# Patient Record
Sex: Female | Born: 1970 | Race: White | Hispanic: No | Marital: Married
Health system: Southern US, Community
[De-identification: ages and names within clinical notes are randomized; demographics above are authoritative.]

---

## 2006-01-02 ENCOUNTER — Encounter: Admission: RE | Admit: 2006-01-02 | Discharge: 2006-01-02 | Payer: Self-pay | Admitting: Family Medicine

## 2007-02-08 ENCOUNTER — Encounter: Admission: RE | Admit: 2007-02-08 | Discharge: 2007-02-08 | Payer: Self-pay | Admitting: Family Medicine

## 2008-11-16 ENCOUNTER — Ambulatory Visit: Payer: Self-pay | Admitting: Interventional Radiology

## 2008-11-16 ENCOUNTER — Emergency Department (HOSPITAL_BASED_OUTPATIENT_CLINIC_OR_DEPARTMENT_OTHER): Admission: EM | Admit: 2008-11-16 | Discharge: 2008-11-16 | Payer: Self-pay | Admitting: Emergency Medicine

## 2010-02-24 ENCOUNTER — Encounter: Payer: Self-pay | Admitting: Family Medicine

## 2010-04-08 ENCOUNTER — Other Ambulatory Visit: Payer: Self-pay | Admitting: *Deleted

## 2010-04-08 DIAGNOSIS — Z1239 Encounter for other screening for malignant neoplasm of breast: Secondary | ICD-10-CM

## 2010-04-15 ENCOUNTER — Ambulatory Visit
Admission: RE | Admit: 2010-04-15 | Discharge: 2010-04-15 | Disposition: A | Discharge status: Home / Self Care | Payer: BC Managed Care – PPO | Source: Ambulatory Visit | Attending: Physician Assistant | Admitting: Physician Assistant

## 2010-04-15 DIAGNOSIS — Z1239 Encounter for other screening for malignant neoplasm of breast: Secondary | ICD-10-CM

## 2013-11-16 ENCOUNTER — Other Ambulatory Visit: Payer: Self-pay

## 2013-11-16 DIAGNOSIS — Z1231 Encounter for screening mammogram for malignant neoplasm of breast: Secondary | ICD-10-CM

## 2013-11-25 ENCOUNTER — Ambulatory Visit
Admission: RE | Admit: 2013-11-25 | Discharge: 2013-11-25 | Disposition: A | Discharge status: Home / Self Care | Payer: BC Managed Care – PPO | Source: Ambulatory Visit

## 2013-11-25 DIAGNOSIS — Z1231 Encounter for screening mammogram for malignant neoplasm of breast: Secondary | ICD-10-CM

## 2014-11-02 ENCOUNTER — Other Ambulatory Visit: Payer: Self-pay

## 2014-11-02 DIAGNOSIS — Z1231 Encounter for screening mammogram for malignant neoplasm of breast: Secondary | ICD-10-CM

## 2014-11-28 ENCOUNTER — Ambulatory Visit: Payer: Self-pay

## 2014-12-12 ENCOUNTER — Ambulatory Visit
Admission: RE | Admit: 2014-12-12 | Discharge: 2014-12-12 | Disposition: A | Discharge status: Home / Self Care | Payer: BLUE CROSS/BLUE SHIELD | Source: Ambulatory Visit

## 2014-12-12 DIAGNOSIS — Z1231 Encounter for screening mammogram for malignant neoplasm of breast: Secondary | ICD-10-CM

## 2017-11-03 ENCOUNTER — Other Ambulatory Visit: Payer: Self-pay | Admitting: Obstetrics & Gynecology

## 2017-11-03 DIAGNOSIS — Z1231 Encounter for screening mammogram for malignant neoplasm of breast: Secondary | ICD-10-CM

## 2017-12-03 ENCOUNTER — Ambulatory Visit
Admission: RE | Admit: 2017-12-03 | Discharge: 2017-12-03 | Disposition: A | Discharge status: Home / Self Care | Payer: BLUE CROSS/BLUE SHIELD | Source: Ambulatory Visit | Attending: Obstetrics & Gynecology | Admitting: Obstetrics & Gynecology

## 2017-12-03 DIAGNOSIS — Z1231 Encounter for screening mammogram for malignant neoplasm of breast: Secondary | ICD-10-CM

## 2019-11-08 ENCOUNTER — Other Ambulatory Visit: Payer: Self-pay | Admitting: Family Medicine

## 2019-11-08 DIAGNOSIS — Z1231 Encounter for screening mammogram for malignant neoplasm of breast: Secondary | ICD-10-CM

## 2019-11-25 ENCOUNTER — Other Ambulatory Visit: Payer: Self-pay

## 2019-11-25 ENCOUNTER — Ambulatory Visit
Admission: RE | Admit: 2019-11-25 | Discharge: 2019-11-25 | Disposition: A | Discharge status: Home / Self Care | Payer: BC Managed Care – PPO | Source: Ambulatory Visit | Attending: Family Medicine | Admitting: Family Medicine

## 2019-11-25 DIAGNOSIS — Z1231 Encounter for screening mammogram for malignant neoplasm of breast: Secondary | ICD-10-CM

## 2019-11-30 ENCOUNTER — Other Ambulatory Visit: Payer: Self-pay | Admitting: Family Medicine

## 2019-11-30 DIAGNOSIS — R928 Other abnormal and inconclusive findings on diagnostic imaging of breast: Secondary | ICD-10-CM

## 2019-12-16 ENCOUNTER — Ambulatory Visit: Payer: BC Managed Care – PPO

## 2019-12-16 ENCOUNTER — Other Ambulatory Visit: Payer: Self-pay

## 2019-12-16 ENCOUNTER — Ambulatory Visit
Admission: RE | Admit: 2019-12-16 | Discharge: 2019-12-16 | Disposition: A | Discharge status: Home / Self Care | Payer: BC Managed Care – PPO | Source: Ambulatory Visit | Attending: Family Medicine | Admitting: Family Medicine

## 2019-12-16 DIAGNOSIS — R928 Other abnormal and inconclusive findings on diagnostic imaging of breast: Secondary | ICD-10-CM

## 2020-12-25 ENCOUNTER — Other Ambulatory Visit: Payer: Self-pay | Admitting: Family Medicine

## 2020-12-25 DIAGNOSIS — Z1231 Encounter for screening mammogram for malignant neoplasm of breast: Secondary | ICD-10-CM

## 2021-02-08 DIAGNOSIS — Z1231 Encounter for screening mammogram for malignant neoplasm of breast: Secondary | ICD-10-CM

## 2021-02-12 ENCOUNTER — Other Ambulatory Visit: Payer: Self-pay | Admitting: Family Medicine

## 2021-02-12 DIAGNOSIS — Z1231 Encounter for screening mammogram for malignant neoplasm of breast: Secondary | ICD-10-CM

## 2021-02-27 ENCOUNTER — Ambulatory Visit
Admission: RE | Admit: 2021-02-27 | Discharge: 2021-02-27 | Disposition: A | Discharge status: Home / Self Care | Payer: BC Managed Care – PPO | Source: Ambulatory Visit

## 2021-02-27 DIAGNOSIS — Z1231 Encounter for screening mammogram for malignant neoplasm of breast: Secondary | ICD-10-CM

## 2022-04-15 ENCOUNTER — Other Ambulatory Visit: Payer: Self-pay | Admitting: Family Medicine

## 2022-04-15 DIAGNOSIS — Z1231 Encounter for screening mammogram for malignant neoplasm of breast: Secondary | ICD-10-CM

## 2022-04-16 ENCOUNTER — Ambulatory Visit (INDEPENDENT_AMBULATORY_CARE_PROVIDER_SITE_OTHER): Payer: BC Managed Care – PPO | Admitting: Licensed Clinical Social Worker

## 2022-04-16 DIAGNOSIS — F411 Generalized anxiety disorder: Secondary | ICD-10-CM | POA: Diagnosis not present

## 2022-04-16 DIAGNOSIS — Z566 Other physical and mental strain related to work: Secondary | ICD-10-CM

## 2022-04-16 DIAGNOSIS — F4322 Adjustment disorder with anxiety: Secondary | ICD-10-CM

## 2022-04-16 DIAGNOSIS — Z639 Problem related to primary support group, unspecified: Secondary | ICD-10-CM | POA: Diagnosis not present

## 2022-04-16 NOTE — Progress Notes (Addendum)
Virtual Visit via Video Note  I connected with Tara EatonJennifer Weaver on 04/16/22 at  4:00 PM EDT by a video enabled telemedicine application and verified that I am speaking with the correct person using two identifiers.  Location: Patient: home Provider: home office   I discussed the limitations of evaluation and management by telemedicine and the availability of in person appointments. The patient expressed understanding and agreed to proceed.   I discussed the assessment and treatment plan with the patient. The patient was provided an opportunity to ask questions and all were answered. The patient agreed with the plan and demonstrated an understanding of the instructions.   The patient was advised to call back or seek an in-person evaluation if the symptoms worsen or if the condition fails to improve as anticipated.  I provided 60 minutes of non-face-to-face time during this encounter.  Comprehensive Clinical Assessment (CCA) Note  04/16/2022 Tara EatonJennifer Weaver [id number]  Chief Complaint:  Chief Complaint  Patient presents with   Anxiety   relationship issues   cope with stressors in general   Visit Diagnosis: Generalized anxiety disorder, work stress, relationship problems, adjustment disorder with anxious mood  CCA Biopsychosocial Intake/Chief Complaint:  Has been under a tremendous amount of stress high demanding job sucked the life out of her. Thought doing well handling it but not cause her to step out of marriage while traveling out of character. Husband found out almost divorced working on it. in couple's counseling had first session had individual one this morning he has one next week and then couple's and move forward from there. [facility] [place] specialized in infidelity in marriages  Current Symptoms/Problems: relationship issues, stress, anxiety-probably had all life more so as an adult. PCP Veatrice KellsErica Cox [facility] said anxiety. Has been on Lexapro  20 years not working after awhile Celexa 20 mg and 40 mg for 15 days.   Patient Reported Schizophrenia/Schizoaffective Diagnosis in Past: No   Strengths: blessing and curse very organized and detail oriented, planer, type A, OCD strengths and weaknesses don't know when to let it go. Would like to not be in control all the time when don't do it everyone falls apart they rely on her to get things prepared. People say practice no but if do get into panic and get upset.  Preferences: see above  Abilities: shopping, also shopping is a high just to shop if don't need it buy just because it feels good like beach condo and going out on boat very peaceful, like being with daughter, home improvement, mindless activities.   Type of Services Patient Feels are Needed: therapy, med management   Initial Clinical Notes/Concerns: Treatment history-couple's counseling when first married she was single mom and he lived on own learned to fight fair arguing over ridiculous things learning better how to communicate and fight fair. It was their second marriage. Family history-don't know anything on mom's side on dad's side mental health dad's oldest brother who is deceased diagnosed with paranoid schizophrenia, brother-suffers from mental health 3 years younger diagnosed with bipolar on lots of meds trying to things settled for past several years he has anxiety can't be around them even family not comfortable has therapist, meant a lot with things going on with husband brother very supportive when out to dinner recently medical-two years ago diagnosed renaut's syndrome brought on she thinks by stress rheumatologist said needs to decrease stress root of all problems related to stress.   Mental Health Symptoms Depression:  None (situation issues cause her to be sad)   Duration of Depressive symptoms: No data recorded  Mania:   None   Anxiety:    Worrying; Difficulty concentrating; Fatigue; Irritability;  Restlessness; Tension; Sleep (type A personality hard to go with flow likes to plan and know everything if doesn't have a plan not in control, sometimes worry can be excessive depends what it is. Has a lot going on Mom has lung cancer take care of her and Dad, worry look good-)   Psychosis:   None   Duration of Psychotic symptoms: No data recorded  Trauma:   None   Obsessions:   None   Compulsions:   None   Inattention:   None   Hyperactivity/Impulsivity:   None   Oppositional/Defiant Behaviors:   None   Emotional Irregularity:   None   Other Mood/Personality Symptoms:   anxiety-cont-want to look good for husband, daughter who is married couple years they struggle wants her to be happy. Lately for past 6-7 months so much on plate that things that normally don't miss are missing, can be little things overwhelming. Has been on Trazodone prescribed premenopausal night sweats also mind doesn't shut off    Mental Status Exam Appearance and self-care  Stature:   Tall   Weight:   Underweight   Clothing:   Casual   Grooming:   Normal   Cosmetic use:   None   Posture/gait:   Normal   Motor activity:   Not Remarkable   Sensorium  Attention:   Normal   Concentration:   Normal   Orientation:   X5   Recall/memory:   Normal   Affect and Mood  Affect:   Appropriate   Mood:   Anxious   Relating  Eye contact:   Normal   Facial expression:   Responsive   Attitude toward examiner:   Cooperative   Thought and Language  Speech flow:  Normal   Thought content:   Appropriate to Mood and Circumstances   Preoccupation:   None   Hallucinations:   None   Organization:  No data recorded  [employer] of Knowledge:   Average   Intelligence:   Average   Abstraction:   Normal   Judgement:   Fair   Dance movement psychotherapist:   Realistic   Insight:   Fair   Decision Making:   Impulsive; Paralyzed (Can have problems making  decisions but can be impulsive in decision making)   Social Functioning  Social Maturity:   Responsible   Social Judgement:   Normal   Stress  Stressors:   Relationship; Work   Coping Ability:   Overwhelmed; Exhausted (depleted)   Skill Deficits:   -- (coping with stress, work/life balance, patient will say things nobody else says thinks a good thing)   Supports:   Family (husband, mom-lives with husband)     Religion: Religion/Spirituality Are You A Religious Person?: No How Might This Affect Treatment?: n/a  Leisure/Recreation: Leisure / Recreation Do You Have Hobbies?: Yes Leisure and Hobbies: see above  Exercise/Diet: Exercise/Diet Do You Exercise?: Yes (did for a long time stopped because of work restarted treadmill walk in the neighbor) What Type of Exercise Do You Do?: Run/Walk (first day back goal 4 days or more a week) Have You Gained or Lost A Significant Amount of Weight in the Past Six Months?: No Do You Follow a Special Diet?: Yes Type of Diet: Keto diet on that 3 years Do  You Have Any Trouble Sleeping?: Yes Explanation of Sleeping Difficulties: Trouble getting to sleep on trazodone   CCA Employment/Education Employment/Work Situation: Employment / Work Situation Employment Situation: Employed 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?: No  Education: Education Is Patient Currently Attending School?:  No Last Grade Completed: 12 Name of High School: [employer] [place] born and raised in [place] Did You Graduate From [employer]?: Yes Did Theme park manager?: No Did Designer, television/film set?: No Did You Have Any Scientist, research (life sciences) In School?: n/a Did You Have An Individualized Education Program (IIEP): No Did You Have Any Difficulty At [employer]?: No Patient's Education Has Been Impacted by Current Illness: No   CCA Family/Childhood History Family and Relationship History: Family history Marital status: Married Number of Years Married: 15 What types of issues is patient dealing with in the relationship?: first marriage daughter's father not married, in couple's counseling Are you sexually active?: Yes What is your sexual orientation?: Heterosexual Has your sexual activity been affected by drugs, alcohol, medication, or emotional stress?: no Does patient have children?: Yes How many children?: 1 How is patient's relationship with their children?: Brenna-28-good relationship worries about her and early on in marriage  Childhood History:  Childhood History By whom was/is the patient raised?: Both parents Additional childhood history information: normal childhood Description of patient's relationship with caregiver when they were a child: normal kid mom stayed at home dad worked typical family thing. Patient always wanted to do be independent mouth got in trouble butt heads didn't get in trouble just talked a lot Patient's description of current relationship with people who raised him/her: good How were you disciplined when you got in trouble as a child/adolescent?: normal Does patient have siblings?: Yes Number of Siblings: 1 Description of patient's current relationship with siblings: 1 brother-see assessment Did patient suffer any verbal/emotional/physical/sexual abuse as a child?: No Did patient suffer from severe childhood neglect?: No Has patient ever been  sexually abused/assaulted/raped as an adolescent or adult?: No Was the patient ever a victim of a crime or a disaster?: No Witnessed domestic violence?: No Has patient been affected by domestic violence as an adult?: No  Child/Adolescent Assessment: n/a     CCA Substance Use Alcohol/Drug Use: Alcohol / Drug Use Pain Medications: n/a Prescriptions: Celexa Trazaone, takes Klonopn with flying Over the Counter: n/a History of alcohol / drug use?: No history of alcohol / drug abuse                         ASAM's:  Six Dimensions of Multidimensional Assessment  Dimension 1:  Acute Intoxication and/or Withdrawal Potential:      Dimension 2:  Biomedical Conditions and Complications:      Dimension 3:  Emotional, Behavioral, or Cognitive Conditions and Complications:     Dimension 4:  Readiness to Change:     Dimension 5:  Relapse, Continued use, or Continued  Problem Potential:     Dimension 6:  Recovery/Living Environment:     ASAM Severity Score:    ASAM Recommended Level of Treatment:     Substance use Disorder (SUD)-n/a    Recommendations for Services/Supports/Treatments: Recommendations for Services/Supports/Treatments Recommendations For Services/Supports/Treatments: Individual Therapy, Medication Management  DSM5 Diagnoses: There are no problems to display for this patient.   Patient Centered Plan: Patient is on the following Treatment Plan(s):  Anxiety, work and relationship stress-treatment plan completed at next treatment session   Referrals to Alternative Service(s): Referred to Alternative Service(s):   Place:   Date:   Time:    Referred to Alternative Service(s):   Place:   Date:   Time:    Referred to Alternative Service(s):   Place:   Date:   Time:    Referred to Alternative Service(s):   Place:   Date:   Time:      Collaboration of Care: Other none needed  Patient/Guardian was advised Release of Information must be obtained prior to any record  release in order to collaborate their care with an outside provider. Patient/Guardian was advised if they have not already done so to contact the registration department to sign all necessary forms in order for us to release information regarding their care.   Consent: Patient/Guardian gives verbal consent for treatment and assignment of benefits for services provided during this visit. Patient/Guardian expressed understanding and agreed to proceed.   Coolidge BreezeMary Jehad Bisono, LCSW

## 2022-05-08 ENCOUNTER — Ambulatory Visit (HOSPITAL_COMMUNITY): Payer: BC Managed Care – PPO | Admitting: Licensed Clinical Social Worker

## 2022-05-08 ENCOUNTER — Encounter (HOSPITAL_COMMUNITY): Payer: Self-pay

## 2022-05-08 DIAGNOSIS — Z566 Other physical and mental strain related to work: Secondary | ICD-10-CM

## 2022-05-08 DIAGNOSIS — F411 Generalized anxiety disorder: Secondary | ICD-10-CM | POA: Diagnosis not present

## 2022-05-08 DIAGNOSIS — Z639 Problem related to primary support group, unspecified: Secondary | ICD-10-CM

## 2022-05-08 DIAGNOSIS — F4322 Adjustment disorder with anxiety: Secondary | ICD-10-CM

## 2022-05-08 NOTE — Progress Notes (Addendum)
Virtual Visit via Video Note  I connected with Claris Pong on 05/08/22 at 10:00 AM EDT by a video enabled telemedicine application and verified that I am speaking with the correct person using two identifiers.  Location: Patient: home Provider: home office   I discussed the limitations of evaluation and management by telemedicine and the availability of in person appointments. The patient expressed understanding and agreed to proceed.   I discussed the assessment and treatment plan with the patient. The patient was provided an opportunity to ask questions and all were answered. The patient agreed with the plan and demonstrated an understanding of the instructions.   The patient was advised to call back or seek an in-person evaluation if the symptoms worsen or if the condition fails to improve as anticipated.  I provided 54 minutes of non-face-to-face time during this encounter.   THERAPIST PROGRESS NOTE  Session Time: 10:00 AM to 10:54 AM  Participation Level: Active  Behavioral Response: CasualAlertAnxious  Type of Therapy: Individual Therapy  Treatment Goals addressed: Work on coping skills to decrease anxiety, in particular work on coping with job stressors  ProgressTowards Goals: Progressing-completed treatment plan but feel we already are starting to make progress introduced CBT model talked about needing relaxation every day to help manage anxiety, challenging certain mindset's such as control everything perfectionism that contribute to anxiety, patient will start reading book to help with her particular issue of overthinking  Interventions: CBT, Solution Focused, Strength-based, Supportive, and Other: coping  Summary: Latreva Bartnik is a 52 y.o. female who presents with doing couple's counseling had one last week. Trying to do it weekly. Helping basis of their problem is her stress and her job therapist asked if she had help for that and patient said yes has this  therapist.. Applied for short-term disability denied full blown meltdown not ready and scared to go back. No way to handle they have 3 people to do her job right now. The fact that denied freaking her out added stress. FMLA but not going to get paid another thing add to her plate. Wind knocked out of her. Husband said make it work appeal. Denied because not enough information. Patient thinks at this point and asked therapist to send more information.  Send treatment plan and why can't work at this place why stress and anxiety preventing doing her job.  Worked on treatment plan in session with patient she said looking at the whole plan gives her so much anxiety wants to do it but overwhelming. At this point split her job into three people talked about talking to them about how she needs help with the job. The girl filling in good friend said things not be the same have a plan how this has to change. Thinks boss would be on board patient very respected and like her want her back. Will have to say this needs to change for her to do job. Yesterday sobbing and scared when got news patient says not ready to go back everybody knows her including her husband says can't get back. Worked through it do things that made her feel better. Gets Klonopin for when flying crippling anxiety with flying.  Been taking it through the day when working proud of herself this time worked through it shut down a little laid down was ok at the end of day if go back would have to take medication to calm herself and get through the day and that is not good. Doesn't want to but couldn't  do without taking this medicine.  Patient says gets muscle tension heart beating faster and the mind issue(cloudy thinking). When in panic mode always thinking overthing can't shut down brain a lot of that is because of job the planning involved. Mind is like that all the time in every scenario work and personal can't shut off can't stop thinking. Cruise and  already thinking about it even though not for awhile She is sometimes so overworked that stop and breath take deep breaths tries to talk to herself.  Therapist noted this as emotional regulation to slow herself down so positive she takes breaths and slows down.  She says then the panic comes in when comes to job constantly thinking and worrying worried that things won't work out (like flight missed different particulars not go right with the planning) She has to be planning mode so constantly thinking. For her if have to take unpaid leave worries about have to pay bills even though husband said ok but patient thinks to herself does he really mean it so mind keeps going and going snowballs. Starts and can't stop. Will have to tell herself stop breath relax and you got that not a big deal talk to herself down. Reviewed her anxiety also has a OCD things have to be very particular thinks a control thing if control everything keeps anxiety down. Does clean the house a lot do the laundry have to get everything done before relax nothing going to happen but has to control at all times. Therapist guiding patient about  letting go of some of that control because it is a losing battle reality is that we can't control everything.  Discussed anxiety at its heart is fighter flight needs relaxation to deactivate patient does get on tredmill every day. Refinishing furniture put a lot of pressure on herself high standard thinks some related to wanting acceptance of parents. They are loving patient does beyond so Dad is proud knows he is think different expectation on her than brother who has issues has bipolar struggles. Patient has the good job, married and a lot of pressure helps them with doctor's appointment. Think when parents pass can breath and pressure not be there. Maybe the pressure on herself thinking they put pressure know they don't but there is that expecation patient only one.  Said we need to reshape some things has  to live for herself patient says when she does that live for herself people around her say why doing that feels trapped in her ability to do this. Panic ensues in everyone else. Therapist challenged her have to live more for herself. Therapist challenged mindset what is feeding her anxiety. Perfectionism and approval from others.    Reviewed treatment plan and patient gave consent to complete virtually.  Introduced patient to CBT model for anxiety how we are triggered patient's case its her job that leads to certain thoughts, the thoughts starts the fighter flight adrenaline's cycle that leads to behaviors and we can intervene in this cycle to help with anxiety.  Reviewed symptoms of the adrenaline cycle why we have the symptoms with anxiety is to help [place] to either fight or fully why we have rapid heartbeat why we have shallow breathing reviewed with patient symptoms that she gets.  Noted important aspect of anxiety program is relaxation to address the physical aspect of anxiety.  To slow down the fighter flight noted patient gets on the treadmill every day and also does refurbishing.  Have to work on  challenging distortions in thinking patterns.  Also noticed mindset therapist challenged of trying to control everything challenging that is unrealistic as well as approval from others and perfectionist thinking all these needs to be challenged and changed to help with anxiety.  Patient also has OCD which feeds into her ruminating thoughts making it even more pronounced.  Therapist noted we cannot control everything but we have to live with amount of risk knowing it will be okay.  Therapist said patient slowing down and challenging thoughts is actually good coping strategy, needs to be more aware of unhelpful thoughts and distance herself from them as boring is unhelpful problem solving.  Patient needs therapist to write note or send records to insurance company therapist will look at that this weekend.  Therapist  provided support and space for patient to talk about thoughts and feelings in session  Plan: Return again in 1 week.2.  Patient start book "Stop Overthinking". 3.  Finish CBT for anxiety, look at video, look at generalized anxiety disorder handout from psychology tool look at anxiety book mindset's that feed anxiety,  Diagnosis: Generalized anxiety disorder, problems, work stress adjustment disorder with anxious mood  Collaboration of Care: Other none needed  Patient/Guardian was advised Release of Information must be obtained prior to any record release in order to collaborate their care with an outside provider. Patient/Guardian was advised if they have not already done so to contact the registration department to sign all necessary forms in order for [place] to release information regarding their care.   Consent: Patient/Guardian gives verbal consent for treatment and assignment of benefits for services provided during this visit. Patient/Guardian expressed understanding and agreed to proceed.   Coolidge Breeze, LCSW 05/08/2022

## 2022-05-12 ENCOUNTER — Telehealth (HOSPITAL_COMMUNITY): Payer: Self-pay | Admitting: Licensed Clinical Social Worker

## 2022-05-12 ENCOUNTER — Encounter (HOSPITAL_COMMUNITY): Payer: Self-pay | Admitting: Licensed Clinical Social Worker

## 2022-05-12 NOTE — Telephone Encounter (Signed)
Therapist sent letter through email to patient and documenting wipers therapist assessment patient had severe symptoms significant distress and unable to perform work duties.  Guided patient to contact medical records for therapist assessment and notes to send along with records

## 2022-05-14 ENCOUNTER — Ambulatory Visit (HOSPITAL_COMMUNITY): Payer: BC Managed Care – PPO | Admitting: Licensed Clinical Social Worker

## 2022-05-14 DIAGNOSIS — F411 Generalized anxiety disorder: Secondary | ICD-10-CM | POA: Diagnosis not present

## 2022-05-14 DIAGNOSIS — F4322 Adjustment disorder with anxiety: Secondary | ICD-10-CM

## 2022-05-14 DIAGNOSIS — Z566 Other physical and mental strain related to work: Secondary | ICD-10-CM | POA: Diagnosis not present

## 2022-05-14 DIAGNOSIS — Z639 Problem related to primary support group, unspecified: Secondary | ICD-10-CM

## 2022-05-14 NOTE — Progress Notes (Signed)
Virtual Visit via Video Note  I connected with Tara Weaver on 05/14/22 at 10:00 AM EDT by a video enabled telemedicine application and verified that I am speaking with the correct person using two identifiers.  Location: Patient: home Provider: home office   I discussed the limitations of evaluation and management by telemedicine and the availability of in person appointments. The patient expressed understanding and agreed to proceed.  I discussed the assessment and treatment plan with the patient. The patient was provided an opportunity to ask questions and all were answered. The patient agreed with the plan and demonstrated an understanding of the instructions.   The patient was advised to call back or seek an in-person evaluation if the symptoms worsen or if the condition fails to improve as anticipated.  I provided 52 minutes of non-face-to-face time during this encounter.   THERAPIST PROGRESS NOTE  Session Time: 10:00 AM to 10:52 AM  Participation Level: Active  Behavioral Response: CasualAlertAnxious  Type of Therapy: Individual Therapy  Treatment Goals addressed: Work on coping skills to decrease anxiety, in particular work on coping with job stressors  ProgressTowards Goals: Progressing-provided psychoeducation talked about how anxiety can be unhelpful problem solving and ways to direct her mind to better problem solve also talked about mindfulness as a tool bringing more awareness to thoughts help her with more control of them  Interventions: CBT, Solution Focused, Strength-based, Supportive, and Other: mindfulness  Summary: [name] is a 52 y.o. female who presents with disability acknowledge they received the information.  Therapist noted seems to be gathering information not an appeal which is positive patient agrees.  She is stressed but not as stressed as thought would be. Yesterday when woke up chest heavy elephant sitting up chest anxious waking up and  like that all day. Chest pain like going to have a panic attack worked out during the day to help relieve it. Wonders if  subconsciously worried thinks about work and income and can't let it go. Heaviness in herself and smothered by herself.  Therapist agreed there are subconscious thoughts that are triggering anxiety work on management of anxiety in general but also look at panic at night if there are particular interventions that would be helpful. Doesn't need to over think doesn't know how to let it go. Has all of the physical symptoms. Keeps her busy so don't have time to worry.  Knows that we will help patient with better management of thoughts letting go or better management to make her own decision of what to do with thoughts, educating patient on mindfulness     Reviewed with patient current symptoms as panic appears at night noted as we work on anxiety should help with this but we will explore further this particular type of panic.  Work with patient on particular problem of letting go over thoughts guided her in an exercise of looking at real problems or hypothetical worry guiding her brain so she lets go of things that she cannot problem solve she can write down of what she wants to do but then let it go.  Also looked at mindfulness educating patient so helpful to have a self observing self that helps implement helpful coping more aware when thoughts are becoming ruminative can intervene and make choices about how she wants to manage.  Guided her an exercise of mindfulness and letting go helping her to let go of problems being one of the options so she does not become entangled in the emotion.  Also positive patient being book "stop overthinking" to help make further progress  Suicidal/Homicidal: No  lan: Return again in 1 week.2.  Continue to look at books stop overthinking, look at anxiety book mindset's creating anxiety as well as worksheet on generalized anxiety from psychology  tools  Diagnosis: Generalized anxiety disorder, problems, work stress adjustment disorder with anxious mood  Collaboration of Care: Other none needed  Patient/Guardian was advised Release of Information must be obtained prior to any record release in order to collaborate their care with an outside provider. Patient/Guardian was advised if they have not already done so to contact the registration department to sign all necessary forms in order for [place] to release information regarding their care.   Consent: Patient/Guardian gives verbal consent for treatment and assignment of benefits for services provided during this visit. Patient/Guardian expressed understanding and agreed to proceed.   Coolidge Breeze, LCSW 05/14/2022

## 2022-05-20 DIAGNOSIS — Z0289 Encounter for other administrative examinations: Secondary | ICD-10-CM

## 2022-05-22 ENCOUNTER — Ambulatory Visit (HOSPITAL_COMMUNITY): Payer: BC Managed Care – PPO | Admitting: Licensed Clinical Social Worker

## 2022-05-22 DIAGNOSIS — F411 Generalized anxiety disorder: Secondary | ICD-10-CM

## 2022-05-22 DIAGNOSIS — Z639 Problem related to primary support group, unspecified: Secondary | ICD-10-CM

## 2022-05-22 DIAGNOSIS — F4322 Adjustment disorder with anxiety: Secondary | ICD-10-CM | POA: Diagnosis not present

## 2022-05-22 DIAGNOSIS — Z566 Other physical and mental strain related to work: Secondary | ICD-10-CM

## 2022-05-22 NOTE — Progress Notes (Addendum)
Virtual Visit via Video Note  I connected with Tara Weaver on 05/22/22 at 11:00 AM EDT by a video enabled telemedicine application and verified that I am speaking with the correct person using two identifiers.  Location: Patient: home Provider: home office   I discussed the limitations of evaluation and management by telemedicine and the availability of in person appointments. The patient expressed understanding and agreed to proceed.   I discussed the assessment and treatment plan with the patient. The patient was provided an opportunity to ask questions and all were answered. The patient agreed with the plan and demonstrated an understanding of the instructions.   The patient was advised to call back or seek an in-person evaluation if the symptoms worsen or if the condition fails to improve as anticipated.  I provided 50 minutes of non-face-to-face time during this encounter.  THERAPIST PROGRESS NOTE  Session Time: 11:00 AM to 11:50 AM  Participation Level: Active  Behavioral Response: CasualAlertAnxious and appropriate  Type of Therapy: Individual Therapy  Treatment Goals addressed: Work on coping skills to decrease anxiety, in particular work on coping with job stressors  ProgressTowards Goals: Progressing-patient utilizing her resources to develop coping skills, applying skills and starting to move forward in managing anxiety  Interventions: CBT, Solution Focused, Strength-based, Supportive, and Other: Coping  Summary: Tara Weaver is a 52 y.o. female who presents with giving her through today as far as disability and see if approve until May if not go to plan B which is just have to cope. Yesterday started off good ended badly. Having a good day check work email patient has to do plan for whole company for the year all of [place] what they can and can't do. Patient is the only one who can do it, several month process doesn't get things finalized until July. Early  for [place] to send their part. A problem only one can do it. Panicked will have to go in to work to do this and not ready. Comic when looking back at all what doing at work don't know how did it. Right now more than one person doing bare minimum of her job. Could wait until July but will think and stress about it. Patient said will give her coworker access and another girl do layers.  Patient can figure out the rest when get back. She can handle that and figure out when get back patient has a system that is reassuring she can use when get back. Was going to meet up with coworker but thought it would trigger anxiety and overthinking pressure on herself. Need to disconnect from things about work thought would bring on anxiety. Felt anxious crawling under rock didn't want to leave house went to bed like this. This morning exercise, read the book thought leave this to the side and it will be fine. Talking parts of the book and put them to use, able to stop herself sometimes, was trying hard yesterday then spiraling thinks that was because she pushed aside instead of dealing with it why anxiety as opposed to today deal with and way to talk to herself that it will be fine. Looking at book and what stood out to patient. Identify triggers which is work pressure. Awareness. 4 A's of stress management was very helpful. Avoid certain triggers work for her like going to grocery store early. Alter-this would be something coming back to work and alter job to be like less stressful. Acceptance would be accepting these changes. Can't do  it at all accept that. Perfectionist who she is carried out a lot at work but consequences not good one thing to work on therapy alter perfectionist thinking. Adapt don't have to be perfect. She is like that in every aspect of her life. Grounding technique with senses use that technique a few times, anxiety goes down calm mind and rework to what anxious about gone. Five A's another helpful thing  from book-what can and can't control, focus on what can you do not what can't, focus on what you have not don't have, focus on the present not the past and future where anxiety comes from instead of focusing on today and enjoy what going on now. Focus on what need and not what want. Focusing on what needs helps a person to be more resilient understand want not a need so able to let it go. Mental minimization -trim things down and don't try to control decisions that are  extreme. We can become confused with what other people say that we need and what people want for [place]. Patient applies to herself always doing things for parents to be happy if did this put what she wanted to side and focuses on what they want. Whole life do what others expect try to live up to that. Knows needs to work on focusing on her own needs. Learning to distract at that right moment doesn't matter what do break rumination cycle. Thoughts with would have, could have can use distract helpful if use consciously and with purpose. How to get out of what if patient goes through book look at things marked to help her. Developing and applying mindset fine either way not end of world. sit back intellectually snap out of it. Sometimes works and sometimes doesn't. Yesterday did what thought at moment avoid things related to work and set boundary. Therapist noted she is developing self-talk that is supportive and useful, that she is rewiring thought patterns.    Patient reviewed with therapist helpful sections of book "stop overthinking" reviewed helpfulness of 4A's which is avoid alter except adapt applied this to patient's work. Noted attitudes helpful for managing anxiety. Thinks like what we need not what we want helps patient work on on some attitudes such as pleasing others and instead focus on her own needs. Noted patient needing to work on perfectionistic thinking, worked on challenging this as thought distortion nothing is perfect so just  setting herself for negative emotions. Discussed that rumination is often the quality of thoughts and using CBT to change thought distortions, change models of how to see things. Pointing out ways patient is doing that redirecting her thoughts more positively. Discussed grounding as helpful for patient to get out of negative thought patterns. Discussed rumination is often escape from difficult emotions. Discussed rumination is unhelpful way to cope and needing to find more helpful strategies.   Suicidal/Homicidal: No  Plan: Return again in 1 week.2.Look at "Stop Overthinking Book" as needed, look at handout on Generalized Anxiety Disorder, look at characteristics that predisposed people to anxiety from anxiety book such as need to control, need to please others.   Diagnosis:  Generalized anxiety disorder, problems, work stress adjustment disorder with anxious mood  Collaboration of Care: Other none needed  Patient/Guardian was advised Release of Information must be obtained prior to any record release in order to collaborate their care with an outside provider. Patient/Guardian was advised if they have not already done so to contact the registration department to sign all necessary  forms in order for [place] to release information regarding their care.   Consent: Patient/Guardian gives verbal consent for treatment and assignment of benefits for services provided during this visit. Patient/Guardian expressed understanding and agreed to proceed.   Coolidge Breeze, LCSW 05/22/2022

## 2022-05-29 ENCOUNTER — Ambulatory Visit (HOSPITAL_COMMUNITY): Payer: BC Managed Care – PPO | Admitting: Licensed Clinical Social Worker

## 2022-05-29 ENCOUNTER — Ambulatory Visit
Admission: RE | Admit: 2022-05-29 | Discharge: 2022-05-29 | Disposition: A | Discharge status: Home / Self Care | Payer: BC Managed Care – PPO | Source: Ambulatory Visit | Attending: Family Medicine | Admitting: Family Medicine

## 2022-05-29 DIAGNOSIS — F4322 Adjustment disorder with anxiety: Secondary | ICD-10-CM

## 2022-05-29 DIAGNOSIS — Z1231 Encounter for screening mammogram for malignant neoplasm of breast: Secondary | ICD-10-CM

## 2022-05-29 DIAGNOSIS — Z639 Problem related to primary support group, unspecified: Secondary | ICD-10-CM

## 2022-05-29 DIAGNOSIS — Z566 Other physical and mental strain related to work: Secondary | ICD-10-CM | POA: Diagnosis not present

## 2022-05-29 DIAGNOSIS — F411 Generalized anxiety disorder: Secondary | ICD-10-CM

## 2022-05-29 NOTE — Progress Notes (Signed)
Virtual Visit via Video Note  I connected with Tara Weaver on 05/29/22 at 10:00 AM EDT by a video enabled telemedicine application and verified that I am speaking with the correct person using two identifiers.  Location: Patient: home Provider: home office   I discussed the limitations of evaluation and management by telemedicine and the availability of in person appointments. The patient expressed understanding and agreed to proceed.   I discussed the assessment and treatment plan with the patient. The patient was provided an opportunity to ask questions and all were answered. The patient agreed with the plan and demonstrated an understanding of the instructions.   The patient was advised to call back or seek an in-person evaluation if the symptoms worsen or if the condition fails to improve as anticipated.  I provided 45 minutes of non-face-to-face time during this encounter.   THERAPIST PROGRESS NOTE  Session Time: 10:00 AM to 10:45 AM  Participation Level: Active  Behavioral Response: CasualAlertAnxious  Type of Therapy: Individual Therapy  Treatment Goals addressed: Work on coping skills to decrease anxiety, in particular work on coping with job stressors  ProgressTowards Goals: Progressing-worked on one of the things Generalized anxiety disorder not being able to tolerate uncertainty coming to more acceptance that there will be uncertainty focusing on what is healthy problem solving also recognizing along with patient working on coping strategies changes need to be made in her environment  Interventions: CBT, Solution Focused, Strength-based, Supportive, and Reframing  Summary: Tara Weaver is a 52 y.o. female who presents with working with therapist on handout about generalized anxiety disorder patient can see tendencies in her how she cope similarly with people with this diagnosis.  She refuses delegating to other people. Don't avoid and procrastinate but seek a lot  of reassurance.  Looking at a pamphlet on generalized anxiety disorder looking specifically of 1 of patient's issues of inability to tolerate uncertainty and so continues to make plans to manage but backfire's because more uncertainty comes as a result. Patient says cover for 18 different scenarios so they are covered.  Patient notices how she deals with uncertainty fit many of the things noted in the handout. Gives an example gifted the [place] trail to husband timed out exactly the minutes with the tour down to the minute. Therapist noted control the things of things can't control and patient agreed didn't work out.  Patient says at 81 list of photos in album was like this before but progressed since then. Patient identifies Worrying, asking yourself "what if" questions, and thinking 'three moves ahead' Says if goes sideways falls apart therapist pointed out that we need to develop strategies that work given the reality of things being uncertain not to continue the chain of worries and what if's. She  also does gathering information and resources These strategies all 'work' in the short term to reduce uncertainty. In the long-term they don't help you to overcome your GAD, because there will always be uncertainty to manage, and they  don't help you to learn to tolerate anxiety.  Patient feels need to be in control and takes on more things. Even though work knows that they give her more. She is planner but over planner and overcompensation and not delegating. Learning to delegate because not there.  Reminded patient of hypothetical worry tree to identify better what is useful problem solving from hypothetical worry which is something she can't do anything about right now, write what she can do and then she lets it go  until there is something she can do.  Ask can I do something then do it and if not let it go. and focusing on something you can do. Therapist noted her strategies to try to control the uncontrollable  are not possible as reality of life is that there is uncertainty.  Therapist believes patient has to have some acceptance of that as well.  Patient shares history for 5 years boss paranoid instilled in that had to plan for all contingencies and fueled her issues. Scared to death have to have a plan he will have a meltdown and come back to her. Boss now totally different if mess up ok. Therapist said good for her but previous boss scared her to worry about messing up. Went from this boos accumulated to horrendous levels then  going from one extreme to another is hard. Therapist said reality times you won't get it right. Now no direction of boss before controlled where don't step out of lines. Thinking what should do where seek reassurance don't need a puppet but need some kind of guidance. Struggling with no guidance not knowing what to do other coworkers feeling the same way. Used to engrain so rigid now in environment on her  own boss doesn't exert any control trying to rein him in get some guidance. Very hard. Thinks he has ADHD all over the place. Patient says he has to chill therapist noticed can't get guidance when all over patient said he has to "slow roll and take a breath". Has approached it making decision go with it.  Since patient making decisions his collegues looking to [place] for guidance since boss can't make decision come to [place] and now more work. Therapist noted unhealthy work environment. 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 People come to her like she runs company not her job can't come to her just because the other persoln can't do job. This is where she is. Set boundaries and stick with them. Say no not doing it. If can't accommodate her will have to go somewhere. Talked about  how to communicate and what is the plan so her needs met so not continue to unfair and  dysfunctional environment. They can say it all day long need to see them change and need to see follow through. Seen it before honeymoon period patient wants to say remember the agreement. Patient wants to develop and write a personal plan obviously when go back saying this is what she needs in order to come back to function back and do well. People doing her job  continue doing this right now three people doing her job. Sit down wiht them a couple of weeks before come back. Therapist said more concrete so better for carrying out. Sit down with HR and boss.  Therapist noted an analogy of sand not holding it tight better manage when not hold tight the idea of let go and not controlling so much to work on that attitude  Attitude of let go when best option. Therapist notes this is practice. Everyone used to her being the planner and when patient said figure it out will be shock. Knows has to practice these skills of letting go and not planing and people will be in shock they are so used to her with plan. Knows worry not good needs to learn how to stop worry  even though says in handout some worry good. Brain has to get better at recognizing useful problem solving that will come with practice and letting go of hypothetical worry that is not useful. Sometimes negative about problem solving when back in corner in that situation  don't think good problem solving come to her even if don't know forced to do stuff that don't know how to do. Therapist said not all on her environment fuels her tendencies so some is for patient to change and some will be  making changes in the environment. Don't need to prepare as much does it and it is the control thing. Where to find balance as some planning is helpful.    Work today with patient on generalized anxiety disorder handout. Can you focus on the things that keep it going related to patient.  Patient recognizes intolerance of uncertainty, may worry in an attempt to anticipate  problems, planning in advance how you might deal with difficulties checking and researching to find out is much as you can but strategies designed to help you to feel certain such as working and planning can backfire: For every what if question there are many uncertain answers which leaves you feeling even less certain.  Therapist noted strategies she is using are not working so have to change therapist noted there has to be a shift in added to recognition you cannot control everything there are things that are on foreseeable in general cannot predict the future.  Therapist said and guided patient to stay in helpful problem solving get better at practicing when it is problem solving and when it is hypothetical worry and letting the hypothetical worry go when she plans out what she can do.  Noted patient getting better at the letting go when she needs to.  Also refer to people living comfortably in life live with uncertainty knowing it is there but also recognizing risk is low.  Managing things and making them certain is like trying to change the direction of flow of water and possible.  Noted environment feeds into her tendencies so part of the issue will be changing what she needs to in her environment and we explored how she can do this.  Patient has a very good plan to put something together meet with boss HR to let them know needing direction also she cannot do everything needs to allocate.  Has been helpful for her right now to allocate for her to practice this.  Therapist provided space and support for patient to talk about thoughts and feelings in session.  Purpose added insight about mistakes inevitable and look at it more as a positive inner dialogue could be something like I will do the best I can and 1 way learning grows through mistakes.  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 Patient/Guardian was advised if they have not already done so to contact the registration department to sign all necessary forms in order for [place] to release information regarding their care.   Consent: Patient/Guardian gives verbal consent for treatment and assignment of benefits for services provided during this visit. Patient/Guardian expressed understanding and agreed to  proceed.   Coolidge Breeze, LCSW 05/29/2022

## 2022-05-31 ENCOUNTER — Telehealth (HOSPITAL_COMMUNITY): Payer: Self-pay | Admitting: Licensed Clinical Social Worker

## 2022-05-31 NOTE — Telephone Encounter (Signed)
Therapist sent patient last 2 notes of therapy sessions to be sent to [place] managing her disability

## 2022-06-06 ENCOUNTER — Ambulatory Visit (HOSPITAL_COMMUNITY): Payer: BC Managed Care – PPO | Admitting: Licensed Clinical Social Worker

## 2022-06-06 DIAGNOSIS — F4322 Adjustment disorder with anxiety: Secondary | ICD-10-CM | POA: Diagnosis not present

## 2022-06-06 DIAGNOSIS — F411 Generalized anxiety disorder: Secondary | ICD-10-CM

## 2022-06-06 DIAGNOSIS — Z639 Problem related to primary support group, unspecified: Secondary | ICD-10-CM

## 2022-06-06 DIAGNOSIS — Z566 Other physical and mental strain related to work: Secondary | ICD-10-CM

## 2022-06-06 NOTE — Progress Notes (Signed)
Virtual Visit via Video Note  I connected with Tara Weaver on 06/06/22 at 10:00 AM EDT by a video enabled telemedicine application and verified that I am speaking with the correct person using two identifiers.  Location: Patient: home Provider: home office   I discussed the limitations of evaluation and management by telemedicine and the availability of in person appointments. The patient expressed understanding and agreed to proceed.  I discussed the assessment and treatment plan with the patient. The patient was provided an opportunity to ask questions and all were answered. The patient agreed with the plan and demonstrated an understanding of the instructions.   The patient was advised to call back or seek an in-person evaluation if the symptoms worsen or if the condition fails to improve as anticipated.  I provided 45 minutes of non-face-to-face time during this encounter.  THERAPIST PROGRESS NOTE  Session Time: 10:00 AM to 10:45 AM  Participation Level: Active  Behavioral Response: CasualAlertappropriate reports some anxiety  Type of Therapy: Individual Therapy  Treatment Goals addressed: Work on coping skills to decrease anxiety, in particular work on coping with job stressors  ProgressTowards Goals: Progressing-worked on ways to challenge perfectionist thinking as this is one of the things driving patient's anxiety it includes a healthier basis for self-esteem  Interventions: CBT, Solution Focused, Strength-based, Supportive, and Other: copoing  Summary: Tara Weaver is a 52 y.o. female who presents with got paperwork from insurance company go back to work June 3. Insurance approved May 30. Only paperwork to fill out is to return work. Meeting with people at work on May 13. Head of HR will be there after call text her boss to be part of the meeting this is her plan. HR work out conditions what is going to happen. Scared to death anxious not going to be able to handle  the job overwhelming for her became tearful in session but reminded herself does have options. Therapist thinks splitting up work will help. Patient said people have to do their job can't come to her. None of that texting her on weekends and days off end up where she was before.  Perfectionism handout read three time said could have had a camera following around. Spot on. Things when a young teenager the eating disorder grew up in [facility] conditioned to look at certain way. Remember back in the day only eat celery and lettuce and then work out have to look perfect and the planning and over planning standards too high with hair and skin ingrained in her so young. Even this week said to husband crying feeling like a failure. Can see what she has been doing hard to change impossible standards at job self-worth tied to it if mess up screwed up and have to try harder. People say she is perfect says not but in mind can't make a mistake house should look a certain way. Therapist asked how to break it over the years better with husband don't have to do something at all times her thinking if not doing something a loser and of course the anxiety. Manifested that way whole life Dad has OCD planner. Everything needs to be perfect at all times. Patient always seeking approval of parents shouldn't leave the house without being presentable. Can't dress that way or have to have certain [place] make sure it is that. Even this day wants parents to be proud of her know they are but they don't say expectation of her  are different from brother patient has to be responsible they don't even know put on her these expectations. Jen handle it snow ball and manifest over the yers. Expectations and don't realize have them and patient just follows through and does it.  Therapist noted to stop the pattern and patient says people freak out if did live her life. Therapist says relearn and reprogram.  Only care about parents patient says but therapist still they have high standards. Always an ultimatum, a condition if stand up to them can't really be free. Let's her daughter have her own life. She thinks if parents passed finally free. Bad because would miss the terribly Patient's shoulder and neck bothering her mom call her twice a day to ask how she is. Has been perfectionism for so long don't know how to change.  Looking at perfectionist handout one of the areas is over planning therapist noted some ways this may be a good coping.  Looked at handout 7 strategies for perfectionism see below.     Patient able to see some of the sources of her perfectionism which plays a part in her anxiety comes from how she was raised from parents seeking their approval.  This began to work on changing the story to help her with better coping.  This includes setting her own standards not relying on what other people think will be living more authentic life not trying to live up to make other people happy but herself.  Noted healthier and more accurate sense of self-esteem not from her achievements but unconditional worth sets a better foundation so it gives her the space to grow as she can make mistakes and not challenge her sense of self worth. Looked with patient on article from [employer] "7 steps to overcome perfectionism" first recommendation is to gain awareness of your thoughts and tendencies therapist said write down any thoughts that comes to mind around a task he felt you needed to do perfectly whether it feels rational or not as you write down your thoughts what theme start to show when she identify the thoughts themes and behaviors you can start to change them.  Challenge her thoughts with concrete facts.  Allow her self to make mistakes it is hard for patient to do that suggestion is to try something new and see how she will have to make mistakes and learning.  Alter her negative self talk from not being good  enough changing self talk to more positive will have a positive effect on self-esteem which can lead to a healthy outlook on life.  Practice acceptance and good enough.  Therapist noting it actually is a better strategy is more efficient if we work toward perfectionism too time-consuming and never really even reached that point so ineffective send possible standard set ourselves up to be disappointed when we cannot reach this standard.  Set reasonable goals.  Use smart as a guideline goals that are specific measurable attainable relevant time-based or timely.  Consider breaking down tasks to smaller ones if you have perfectionist tendencies and all or nothing mindset that you might find that thinking and such extremes can create a vicious cycle That only makes you feel worse.  To combat this try breaking down your tasks into smaller more manageable ones when you check off 1 task at a time it can lead to more feelings of accomplishment and fewer feelings of failure or overwhelm.  Therapist and patient will both continue to look at [facility] articles  for perfectionism for more guidelines.  Assess clinically progress in identifying significant source of anxiety is perfectionist thinking. Suicidal/Homicidal: No  Plan: Return again in 1 week.  Article from [employer] "perfectionism and anxiety often go hand-in-hand"," 7 steps to overcome perfectionism" another articles on perfectionism and other articles an review next week with therapist,  Diagnosis: Generalized anxiety disorder, problems, work stress adjustment disorder with anxious mood  Collaboration of Care: Other none needed  Patient/Guardian was advised Release of Information must be obtained prior to any record release in order to collaborate their care with an outside provider. Patient/Guardian was advised if they have not already done so to contact the registration department to sign all necessary forms in order for [place] to release information  regarding their care.   Consent: Patient/Guardian gives verbal consent for treatment and assignment of benefits for services provided during this visit. Patient/Guardian expressed understanding and agreed to proceed.   Coolidge Breeze, LCSW 06/06/2022

## 2022-06-11 ENCOUNTER — Ambulatory Visit (HOSPITAL_COMMUNITY): Payer: BC Managed Care – PPO | Admitting: Licensed Clinical Social Worker

## 2022-06-11 DIAGNOSIS — F411 Generalized anxiety disorder: Secondary | ICD-10-CM | POA: Diagnosis not present

## 2022-06-11 DIAGNOSIS — Z566 Other physical and mental strain related to work: Secondary | ICD-10-CM | POA: Diagnosis not present

## 2022-06-11 DIAGNOSIS — F4322 Adjustment disorder with anxiety: Secondary | ICD-10-CM

## 2022-06-11 DIAGNOSIS — Z639 Problem related to primary support group, unspecified: Secondary | ICD-10-CM

## 2022-06-11 NOTE — Progress Notes (Signed)
Virtual Visit via Video Note  I connected with Tara Weaver on 06/11/22 at  9:00 AM EDT by a video enabled telemedicine application and verified that I am speaking with the correct person using two identifiers.  Location: Patient: home Provider: home office   I discussed the limitations of evaluation and management by telemedicine and the availability of in person appointments. The patient expressed understanding and agreed to proceed.   I discussed the assessment and treatment plan with the patient. The patient was provided an opportunity to ask questions and all were answered. The patient agreed with the plan and demonstrated an understanding of the instructions.   The patient was advised to call back or seek an in-person evaluation if the symptoms worsen or if the condition fails to improve as anticipated.  I provided 50 minutes of non-face-to-face time during this encounter.  THERAPIST PROGRESS NOTE  Session Time: 9:00 AM to 9:50 AM  Participation Level: Active  Behavioral Response: CasualAlertappropriate  Type of Therapy: Individual Therapy  Treatment Goals addressed: Work on coping skills to decrease anxiety, in particular work on coping with job stressors  ProgressTowards Goals: Progressing-looked at articles on reflective questions to help with perfectionism as well as affirmations noted patient starting to practice things such as resisting the urge to do more, fix able to take steps to find balance will continue to work on this  Interventions: Solution Focused, Strength-based, Supportive, and Other: coping  Summary: Tara Weaver is a 52 y.o. female who presents with last Friday met with the two girls who supported her when out and they spend 41/2 hours catching up about past things, how they are dealing with things when she is not there. Patient felt calm when left and excited to go back. They stepped up and they are adamant about protecting her and not letting her do  this alone. One girl came up with a four page plan this is what needs to be done in order to come back. Some people will continue to do what they are doing, they have survived without her, certain things can continue to do. Reach out to boss and HR and meet next week both on Monday and Thursday. Patient says she knows controlling  process but what needs to be done and therapist agrees set the boundaries..Fixed so not stressed when come back. First time felt support and love not only work wise but  personally. Group text patient sent to thank them for what done and doing and their response was to thank her for allowing them to support her. Patient says she is not person to allow people to help grateful that she allowed that. Can focus on her job true job what she should be doing couldn't do before. Have to make sure keep boundaries so people don't dump on her. July 1 to September 1 out of office will work also on vacation. September a little bit of fear people going away easy to set boundaries when back honeymoon over make sure that the boundaries still there test it a little bit.   Looked at articles from [employer] on perfectionism that are very helpful in which patient found.  1 titled "Reflective questions to help you quiet your inner perfectionist". Patient pointing out using reflective questions such as resist the urge to do more, fix, edit, or re-do. Asking questions such as what is 1 thing you can leave unfinished or imperfect? Why are fun and self-care important to a happy healthy life? If you neglected  these areas of your life what were the effects?.  Patient pointing out does not have a hobby now going to give herself time to not always be productive give herself time to do things she enjoys. Therapist guided patient and looking at things as meaningful a good place to start not just what is productive. Another reflective question centered around perfectionism has a purpose. What do you think your  perfectionism is trying to protect you from? What can you say to remind yourself that you are enough just as you are? Therapist noted acceptance of mistakes is how we grow just because we mistakes does not define our core value.  Post also noted key concept of self-esteem is that we all have value do not have to look externally for that Also looked at affirmations to help with perfectionism to help guide [place] and changing what we doing what we think. As thinking patterns take time to change. Some of the affirmations were-I choose to enjoy the process, not just focus on the outcome. I dont have to do things perfectly. Patient learning with self talk to tolerate things being unfinished that it is okay that she is good enough she is learning to balance better not having to do everything leave things normally when she would do all.  Therapist adding to do things for herself not to please others otherwise you are living to please others.  Therapist provided support and space for patient to talk about thoughts and feelings in session   Doesn't have hobby not allowing herself to do some of her hobbies like baking. Now make it happen going to hobbies not just exhausted on weekends and don't do anything. Being with dog is one emotional supportive animal. Perfectionism purpose what if remove afraid people not good enough. Good job not failing why need to be perfect. In other people's eyes they think she is perfect she feels like not perfect of not 110 shift her mind wake up still doing so well. Self-talk out of it. Affirmations help train mind to think more postively. Enjoy the process.   Suicidal/Homicidal: No  Plan: Return again in 1 week.2.  Look for article about ways to counter being busy by doing what is meaningful, can look at old notes on perfectionism, look at anxiety phobia book chapter on predispositions to anxiety, look more at healthy ways to develop self-esteem  Diagnosis: Generalized anxiety disorder,  problems, work stress adjustment disorder with anxious mood  Collaboration of Care: Other none needed  Patient/Guardian was advised Release of Information must be obtained prior to any record release in order to collaborate their care with an outside provider. Patient/Guardian was advised if they have not already done so to contact the registration department to sign all necessary forms in order for [place] to release information regarding their care.   Consent: Patient/Guardian gives verbal consent for treatment and assignment of benefits for services provided during this visit. Patient/Guardian expressed understanding and agreed to proceed.   Coolidge Breeze, LCSW 06/11/2022

## 2022-06-19 ENCOUNTER — Ambulatory Visit (HOSPITAL_COMMUNITY): Payer: BC Managed Care – PPO | Admitting: Licensed Clinical Social Worker

## 2022-06-19 DIAGNOSIS — Z639 Problem related to primary support group, unspecified: Secondary | ICD-10-CM | POA: Diagnosis not present

## 2022-06-19 DIAGNOSIS — F4322 Adjustment disorder with anxiety: Secondary | ICD-10-CM

## 2022-06-19 DIAGNOSIS — F411 Generalized anxiety disorder: Secondary | ICD-10-CM

## 2022-06-19 DIAGNOSIS — Z566 Other physical and mental strain related to work: Secondary | ICD-10-CM

## 2022-06-19 NOTE — Progress Notes (Signed)
Virtual Visit via Video Note  I connected with Tara Weaver on 06/19/22 at 11:00 AM EDT by a video enabled telemedicine application and verified that I am speaking with the correct person using two identifiers.  Location: Patient: home Provider: home office   I discussed the limitations of evaluation and management by telemedicine and the availability of in person appointments. The patient expressed understanding and agreed to proceed.   I discussed the assessment and treatment plan with the patient. The patient was provided an opportunity to ask questions and all were answered. The patient agreed with the plan and demonstrated an understanding of the instructions.   The patient was advised to call back or seek an in-person evaluation if the symptoms worsen or if the condition fails to improve as anticipated.  I provided 53 minutes of non-face-to-face time during this encounter.   THERAPIST PROGRESS NOTE  Session Time: 11:00 AM to 11:53 AM  Participation Level: Active  Behavioral Response: CasualAlertEuthymic  Type of Therapy: Individual Therapy  Treatment Goals addressed: Work on coping skills to decrease anxiety, in particular work on coping with job stressors  ProgressTowards Goals: Progressing-working on identifying and working on personality styles that perpetuate anxiety as well as Pharmacologist. Today we worked more on perfectionism  Interventions: CBT, Solution Focused, Strength-based, Supportive, and Other: coping  Summary: Tara Weaver is a 52 y.o. female who presents with 8 day cruise looking forward to it. Met with HR Monday evening went well so much anxiety before felt physically ill, had to take medications to calm down couldn't eat. Reason was that she had to go to the office. Had a very time before Monday nightmares before somebody coming for her like people bombarding her. Today not worried because Monday went well. Girl working for her and her talked past  boss what it was like and really was trauma working for him. So traumatized so afraid to make decisions so controlled by him. Did whatever can could be very unreasonable plan is to just leave her alone and have work/life balance. Therapist noted that still work to do with having that amount of anxiety and patient agreed. Tried to talk herself down the fact that hadn't been there so long. Know will be like that when go back. Even though will be back office. The two girls talking to the bosses to  leave her alone. These people will came right at her. HR person asked what she will say patient taking time out for personal reasons out thanks for asking.  Looked at book "Anxiety and Phobia Workbook" personality styes that can perpetuate anxiety. Patient says that people say she has it all together this standard drives her to try to live up to it. Tells people if they only know would know she is mess. Therapist noted working on self-esteem will help won't have to live up to a standard that she is good enough. Noted mistakes are how we grow. Discussed developing a process orientation to look at life more process in fact good advice for life. In your life in general, are you "playing to win"--channeling your energies into excelling at all costs--or are you enjoying the process of living day by day as you go along? Most people find, especially as they get older, that to get the most enjoyment out of life, it works best to place value on the process of doing things--not just on the product or accomplishment. Popular expressions of this idea include "The journey is more important than the  destination" and "Stop and smell the roses." Therapist noted very good advice. Including doing more things enjoy try to do something enjoy every day.  Patient going on a cruise she relates that timing is good when come back will be a week can relax ride that wave from vacation.  In terms of going back to work even though know anxious keep  talking to herself it's fine we are going to work it through, know what going to say and people have to respect the boundaries. Daughter, 96, asked her, wise beyond years, asks how meeting patient went said went well. She said if too much walk away this is the happiest seen in her life. Patient and therapist very moved by this.   Process patient starting to meet with people at work significant anxiety on Monday therapist noting when the situation has more worse we can make her self more anxious but the same time indication to continue to work on anxiety.  Assessed patient is putting together a good plan that we will help her and her job is being supportive of it..Continue to work on coping strategies for anxiety looked at chapter and "anxiety and phobia book" personality styles that perpetuate anxiety and will look at all of them started with perfectionism.  Explored source of these traits she modeled perfectionism from her mom she also was singled out to be the achiever patient always trying to live up to that standard different one from her brother.  They did not criticize not obviously but different expectation patient always asking her cell for my good enough why she is stroke to be perfect.  Sees many of the traits in herself has a tendency to have expectations about herself others in life that are unrealistically high.  Anything fall short she becomes disappointed or critical.  She also tends to be over concern with small flaws and mistakes in herself or her accomplishments.  Talked about perfectionism is a common cause of low self-esteem conversely one of the ways to work on perfectionism is to work on self-esteem valuing our self with internal standards that we are good enough then were not getting her value from people's approval which is an arbitrary standard our value needs to come from her own sense of worth.  Sees as well and her perfectionism driving her to the point of chronic stress exhaustion and  burnout.  Perfectionism counsels that you should have to or must you tend to push yourself forward out of anxiety rather than from a natural desire and inclination the more perfectionist you are the more often you are likely to feel anxious.  Overcoming perfectionism requires a fundamental shift and attitude toward yourself and how you approach life in general something that patient knows will take time to have the shift.  First like of the idea that you are worth as determined by your treatments or accomplishments.  When self reflective people or near death they are often only only 2 things that seem to have been important to them about their lives learning how to love others and growing and wisdom.  Guided patient to track perfectionist thinking to noted encounter it things like the should must thinking, overgeneralization's.  Stop magnifying the importance of small errors.  Focus on positives.  Goals that are realistic.  Assess helpful as patient has some guidelines to begin to work on perfectionistic traits.   Suicidal/Homicidal: No  Plan: Return again in 4 weeks.2.Look more at personality styles and mindfulness from trauma  book.   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 Patient/Guardian was advised if they have not already done so to contact the registration department to sign all necessary forms in order for [place] to release information regarding their care.   Consent: Patient/Guardian gives verbal consent for treatment and assignment of benefits for services provided during this visit. Patient/Guardian expressed understanding and agreed to proceed.   Coolidge Breeze, LCSW 06/19/2022

## 2022-07-10 ENCOUNTER — Ambulatory Visit (HOSPITAL_COMMUNITY): Payer: BC Managed Care – PPO | Admitting: Licensed Clinical Social Worker

## 2022-07-10 DIAGNOSIS — F4322 Adjustment disorder with anxiety: Secondary | ICD-10-CM

## 2022-07-10 DIAGNOSIS — Z566 Other physical and mental strain related to work: Secondary | ICD-10-CM

## 2022-07-10 DIAGNOSIS — F411 Generalized anxiety disorder: Secondary | ICD-10-CM

## 2022-07-10 NOTE — Progress Notes (Signed)
Virtual Visit via Video Encounter:  I connected with Tara Weaver on 07/10/22 at  8:00 AM EDT by a video enabled telemedicine application and verified that I am speaking with the correct person using two identifiers.   Location: Patient: home Provider: home office   I discussed the limitations of evaluation and management by telemedicine and the availability of in person appointments. The patient expressed understanding and agreed to proceed.  I discussed the assessment and treatment plan with the patient. The patient was provided an opportunity to ask questions and all were answered. The patient agreed with the plan and demonstrated an understanding of the instructions.   The patient was advised to call back or seek an in-person evaluation if the symptoms worsen or if the condition fails to improve as anticipated.  I provided 45 minutes of non-face-to-face time during this encounter.   THERAPIST PROGRESS NOTE  Session Time: 8:00 AM to 8:45 AM  Participation Level: Active  Behavioral Response: CasualAlertAnxious  Type of Therapy: Individual Therapy  Treatment Goals addressed: Work on coping skills to decrease anxiety, in particular work on coping with job stressors  ProgressTowards Goals: Progressing-utilized reframing to downsize the risk of interacting with people at [employer]. practice what she is going to say to help her and setting boundaries encouraged patient with coping of starting to engage in social interactions-worked on overthinking that it in fact escalates anxiety so to notice the thoughts and refocus on present life is helpful  Interventions: Solution Focused, Strength-based, Supportive, and Other: reframing  Summary: Tara Weaver is a 52 y.o. female who presents with over the weekend sense of calm slept well on Monday got in the shower almost fainted got out almost fainted anxiety in head normally but it was physically had to stop had to sit on sofa with husband  take deep breaths. Not sure why happened guess it took over. Took Xanax good day in office nobody knows she is there. Mon-Tues-good days Wednesday hit and miss meetings stayed in office not ready see people a lot of anxiety this morning. Fine where she is in her office eventually have to face people. More today thinks it will be better think boss meeting with team and let them know these are the boundaries. Leave her alone feel better after today. Therapist asked patient what she thinks is going to happen? People are coming from a good place and humorous almost patient being kept away therapist noted like a celebrity. People asking is she here can we say hi. Patient's team telling no no leave her alone. Now it is whole thing a cat and mouse thing. People in meetings patient in the back with door shut her team came and got her and rushed her to bathroom and back. Therapist asked people what would happen if run into people think better after meeting today when her boss will lay down the boundaries. People already saying need to talk to have things need. The girls are protecting her say not you don't need to see her. Knew this was going happen. Better after the meeting. Set the stage boss will tell them need her for me. Patient shared some of the things she is going to say. I need to focus on what I need to do don't ask me for things for you go to your assistant and your team you have to resolve. Need to know rules and boundaries first. If patient says no they may not understand knowing the why then hopefully not as bad.  Say for example, "don't have the capacity to do that anymore you have to go back to team". They are so eager to see her overwhelming all come up to see her. Will say thank you for asking overwhelming if all in her business at one time. If see them say hi and move alone. . If need to will say none of business have to blunt if have to. "Doing well thank you for asking". Patient and therapist agree people  can be noisy none of their business. Patient says have to remember that she is the one with power in control they are not. After today patient says as things subside ease her way to be face to face. Have to get  used to seeing people. 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 Whole secret resurrection of her coming back and the chatter of her coming back. Try to ease in to seeing people. Therapist encouraged her with this.  Have to set up plans for company 2025 enjoying quiet time and able to focus good usually have to focus on that with other demands. Boss happy that she is back. Therapist noted nice to have the support. Been able to focus on her job calming to take her time and not going 100 miles and not go in all these different directions calming to be able to focus. Enjoying her job. Therapist guided patient in having gratitude for the changes that are made and also that she was the one initiated it. Patient said grateful especially after today things are going to be better and she is the one in control. Therapist noted she has people looking out for her. Knows she can cope, have the faith in herself to set boundaries already happening. Feels good to be back. Reviewed session feel better today and will let people see her a little more.   Processed with patient anxiety returning to work particularly because she is does not have many interactions with people.  Therapist reinforced she can set the boundary she has practiced which she is going to say which will be helpful people should get the message that she is not going to open up and share just let people know she is doing good.  Therapist thinks practicing what she is going to say helpful also different scenarios how she would handle them we went through today.  Therapist normalized what patient is doing this is what most people do when they go back to work and it is healthy to protect yourself and set boundaries people do not want to open up and share  about their personal business and in fact healthy not to do that.  So patient is practicing healthy practices.  Noted it already is changing to help with her anxiety the company is allowing her to redirect her role just work with the boss and we will support her with this.  Therapist explored worst-case scenario therapist does not think will be so bad knowing patient with her skill set through sessions therapist thinks patient can more than handle it but also guided patient to see social interaction not such a big threat as she thinks.  Patient says she over things so we looked at worksheet on intrusive thoughts noting the problem itself is the worry so engaging with it last noticed thoughts and emotions is passing focus more on present life so does not reinforce anxiety is important.  Therapist pointed out this is another tool for her and in her toolkit along with other  ones.  Therapist provided support and space for patient to talk about thoughts and feelings in session.     Suicidal/Homicidal: No  Plan: Return again in 1 week.2.  Good anxiety and phobia book personality styles of anxiety look at mindfulness from trauma book  Diagnosis: Generalized Anxiety Disorder, Adjustment disorder with anxious mood, work stress  Collaboration of Care: Other none needed  Patient/Guardian was advised Release of Information must be obtained prior to any record release in order to collaborate their care with an outside provider. Patient/Guardian was advised if they have not already done so to contact the registration department to sign all necessary forms in order for [place] to release information regarding their care.   Consent: Patient/Guardian gives verbal consent for treatment and assignment of benefits for services provided during this visit. Patient/Guardian expressed understanding and agreed to proceed.   Coolidge Breeze, LCSW 07/10/2022

## 2022-07-23 ENCOUNTER — Ambulatory Visit (HOSPITAL_COMMUNITY): Payer: BC Managed Care – PPO | Admitting: Licensed Clinical Social Worker

## 2022-07-23 DIAGNOSIS — Z639 Problem related to primary support group, unspecified: Secondary | ICD-10-CM

## 2022-07-23 DIAGNOSIS — Z566 Other physical and mental strain related to work: Secondary | ICD-10-CM | POA: Diagnosis not present

## 2022-07-23 DIAGNOSIS — F411 Generalized anxiety disorder: Secondary | ICD-10-CM | POA: Diagnosis not present

## 2022-07-23 DIAGNOSIS — F4322 Adjustment disorder with anxiety: Secondary | ICD-10-CM

## 2022-07-23 NOTE — Progress Notes (Signed)
Virtual Visit via Video Note  I connected with Tara Weaver on 07/23/22 at  9:00 AM EDT by a video enabled telemedicine application and verified that I am speaking with the correct person using two identifiers.  Location: Patient: home Provider: home office   I discussed the limitations of evaluation and management by telemedicine and the availability of in person appointments. The patient expressed understanding and agreed to proceed.   I discussed the assessment and treatment plan with the patient. The patient was provided an opportunity to ask questions and all were answered. The patient agreed with the plan and demonstrated an understanding of the instructions.   The patient was advised to call back or seek an in-person evaluation if the symptoms worsen or if the condition fails to improve as anticipated.  I provided 48 minutes of non-face-to-face time during this encounter.  THERAPIST PROGRESS NOTE  Session Time: 9:00 AM to 9:48 AM  Participation Level: Active  Behavioral Response: CasualAlertAnxious and Euthymic  Type of Therapy: Individual Therapy  Treatment Goals addressed: Work on Pharmacologist to decrease anxiety, in particular work on coping with job stressors  ProgressTowards Goals: Progressing-utilized shadow workbook to work on perfectionism, anxiety, fear of failure learn new technique EFT tapping to help with working through difficult emotions as well as working on triggers  Interventions: Solution Focused, Strength-based, Supportive, and Other: coping  Summary: Tara Weaver is a 52 y.o. female who presents with did make appearance going good people for most part were respectful one of the guys right after the meeting he marched in to see her one of the guys thought might happen who thinks rules don't rules don't apply to him. Was short and curt with him. A girl with her and could see patient's face so took over the conversation thankful she was there so  awkward, everyone else so kind and helping. A lot of people have just told patient to let her know when ready. Three weeks since officially back took off the out of office from email. . Last Wednesday bad day only in office 4 days working from home and a day working from home. It was chaotic a bunch of stuff going on overwhelmed had a total meltdown unnecessary but a lot of little things but overwhelmed. Threw her for a loop cry and panic worked through and has been fine. Last Wednesday scared her asked herself can she do this job chaotic moment threw her back. Thinks it was part of transition three people have access to calendar another lady was in as well too many cooks in the oven per patient told friend talk to her and patient needs to familiarize she needs to stop and took away her access. Delegating and not take things back a moment where thought get involved and then took back and not involved. Let people do things delegate and telling people this is what going do with limits. Feel good feels not doing enough. Always was so busy feels guilty. Talked to girlfriend who is helping her you are supposed to be doing what doing focus on supervisor's day to day which is a lot. Guess doing what supposed to be doing even though used to be stretch so thin new way of doing it.  Friend said don't overwhelm yourself if left would cry. So validating. Never had people like this genuine people not have ulterior moments. They are protective of her which is good.        Reviewed how things go and at  work and they are going good.  She has the support in place with other people to set limits help her and setting her limits, getting better at delegating.  Has people supporting her in the work she is doing to give her feedback that she is doing enough and not needing to be overwhelmed.  Noted she does get triggered can be overwhelming therapist normalized that can happen along is that is not always happening patient did work  through it and was better.  Helps to have therapy for triggers.  Therapist began to share about bilateral stimulation that happens when walking or sleeping how we can process memories that lead to tapping.Utilize it in the context of the shadow workbook as noted this puts language to what we are working on difficult emotions patterns being able to notice them more a part of the shadow and bringing them to light.  In this work discovering the hidden facets of her personality and integrating these aspects into a cohesive self.  Blending the overlooked parts of our psyche your shadow aspects with your conscious self fostering a more balanced and authentic existence.  Explored one of the activities that can do that is EFT tapping the steps which is state the problem identify the level of pain create your set up statement..  We identified I want to go to a calm full peaceful place and even though I am scared of feeling except and love myself.  The points the Meridian points recheck the level of pain.  Noted EFT tapping can be used to work through difficult emotions and gain insight into the root cause of them can also be used to do shadow work. By tapping on the acupressure points and allowing yourself to become aware of the emotions associated with these parts of yourself you can begin to except intergrade and heal them EFT tapping can help you to gain clarity and insight into the underlying causes of your repress feelings.  We did a practice in session amusing but still thought it would be helpful patient says it was relaxing.  Decided we will do more shadow work and exercises with shadow journal noting following and adding onto what we have been working on the unconscious when patient has difficult emotions, patterns which in our case perfectionism and fear of failure.  Working through repressed painful emotions integrating these parts of yourself that have been repressed for more authentic and whole  self.  Suicidal/Homicidal: No  Plan: Return again in 2 weeks.2.  Continue to look at shadow journal, work on triggers to help in management of anxiety  Diagnosis: Generalized Anxiety Disorder, Adjustment disorder with anxious mood, work stress  Collaboration of Care: Other none needed  Collaboration of Care: Other none needed  Patient/Guardian was advised Release of Information must be obtained prior to any record release in order to collaborate their care with an outside provider. Patient/Guardian was advised if they have not already done so to contact the registration department to sign all necessary forms in order for [place] to release information regarding their care.   Consent: Patient/Guardian gives verbal consent for treatment and assignment of benefits for services provided during this visit. Patient/Guardian expressed understanding and agreed to proceed.   Coolidge Breeze, LCSW 07/23/2022

## 2022-08-06 ENCOUNTER — Ambulatory Visit (HOSPITAL_COMMUNITY): Payer: BC Managed Care – PPO | Admitting: Licensed Clinical Social Worker

## 2022-08-06 DIAGNOSIS — F411 Generalized anxiety disorder: Secondary | ICD-10-CM | POA: Diagnosis not present

## 2022-08-06 DIAGNOSIS — F4322 Adjustment disorder with anxiety: Secondary | ICD-10-CM | POA: Diagnosis not present

## 2022-08-06 DIAGNOSIS — Z566 Other physical and mental strain related to work: Secondary | ICD-10-CM | POA: Diagnosis not present

## 2022-08-06 NOTE — Progress Notes (Signed)
Virtual Visit via Video Note  I connected with Tara Weaver on 08/06/22 at  9:00 AM EDT by a video enabled telemedicine application and verified that I am speaking with the correct person using two identifiers.  Location: Patient: home Provider: home office   I discussed the limitations of evaluation and management by telemedicine and the availability of in person appointments. The patient expressed understanding and agreed to proceed.   I discussed the assessment and treatment plan with the patient. The patient was provided an opportunity to ask questions and all were answered. The patient agreed with the plan and demonstrated an understanding of the instructions.   The patient was advised to call back or seek an in-person evaluation if the symptoms worsen or if the condition fails to improve as anticipated.  I provided 45 minutes of non-face-to-face time during this encounter.  THERAPIST PROGRESS NOTE  Session Time: 9:00 AM to 9:45 AM  Participation Level: Active  Behavioral Response: CasualAlertEuthymic  Type of Therapy: Individual Therapy  Treatment Goals addressed: Work on coping skills to decrease anxiety, in particular work on coping with job stressors  ProgressTowards Goals: Progressing-patient has learned coping skills so anxiety decrease management of work stress is a lot better continue work on symptoms using Jung's shadow approach  Interventions: Solution Focused, Strength-based, Supportive, and Other: shadow work  Summary: Tara Weaver is a 52 y.o. female who presents with really good. Doing good at work girls said proud of her sticking to things and sticking to boundaries. Apparently boss and team scared them nobody will talk to her unless she talks to them first. Very supported honestly don't see a lot of people just do work. Really in control only seen them if want to. Before a free for all.  Patient says enough on her plate it is a task every day to help him  through. Therapist noted it is nice to see how all came together and in a role that works for [employer]. Reminds that he is not old boss everybody afraid to do anything he is easy going able to enjoy her job. A couple of things frustrating but not upset says oh well and not overthinking and letting go. Pacing herself, at her pace. The other day think have to get this done and this done self-talk stopped everything talk to herself it is fine, no rush, take one thing at a time, it will be fine has self-awareness and talks to herself in ways that are realistic.  Balking today and having family over. Going to have fun with family. Going over to [place] in September comfortable with that. Not going to go to other event. Go to [place] priority not going to the other one not going on another trip to the end of the year. Going to [place] next year. Happy and excited before dreading. Glamourous but when overworked exhausted don't get to enjoy and be in the moment.  Now it is different. Worked on the shadow workbook and patient made comments shared our approach seems like a hip noticed with somebody who has trauma and bringing it out therapist noted the same way we are pulling things out from the unconscious to the conscious so that were not just reacting from this unconscious part helping [place] with her symptoms.  Patient made comments on 9 traps pointing out in her own words 1 going back to what we know.  Patient likes looking at the book she is processing and her mine thinking about issues she  is repressed will process in session as well as thinking about it after work to work on these issues.  Therapist reviewed recent events symptoms noted patient applying coping skills and work environment things are going really well.  Identified in session patient having more self-awareness and using self talk for example setting boundaries and she is getting support from work therapist thinks very positive result as she finds herself  enjoying her work able to enjoy things like trips.  Reviewed relationship has been doing marital therapy relates using skills there such as really listening to each other and communicating better.  Therapist is working with patient on shadow workbook Reviewed with patient with the shadow with unconscious aspects of her personality that the ego does not identify with you may experience your shadow when it is triggered in social interactions relationships and episodes of anxiety or sadness.  The shadow is composed of the parts of yourself you have forgotten abandon and repress in order to grow and fit in with the constructs of society and shadow work is the process of revealing excepting and integrating those parts of yourself that you have repressed and rejected.  Therapist explained her understanding is bringing the unconscious to the conscious to better understand our reactions with more awareness we can change her reactions if we so desire but we do not have to continue to react from something that is no longer applicable with more awareness to what is happening her lives now.  The book says the goal is to make the unconscious conscious so may work on the emotions and self reflection and acceptance therapist added growth and change.  In looking at book we looked at my traps which are helpful to see different types of cognitive distortions things like confirmation bias reactance she is doing the opposite of the someone is trying to make you do.  Noting more awareness with these types of mind traps but therapist also believes we are digging deeper with patient's anxiety to get to the source so we continue to make progress with management of symptoms through knowing the source.  Therapist provided space and support for patient to process thoughts and feelings in session. Suicidal/Homicidal: No  Plan: Return again in 3 weeks.2.Continue with Shadow Workbook p.10  Diagnosis: Generalized Anxiety Disorder, Adjustment  disorder with anxious mood, work stress  Collaboration of Care: Other none needed  Patient/Guardian was advised Release of Information must be obtained prior to any record release in order to collaborate their care with an outside provider. Patient/Guardian was advised if they have not already done so to contact the registration department to sign all necessary forms in order for [place] to release information regarding their care.   Consent: Patient/Guardian gives verbal consent for treatment and assignment of benefits for services provided during this visit. Patient/Guardian expressed understanding and agreed to proceed.   Coolidge Breeze, LCSW 08/06/2022

## 2022-08-27 ENCOUNTER — Ambulatory Visit (HOSPITAL_COMMUNITY): Payer: BC Managed Care – PPO | Admitting: Licensed Clinical Social Worker

## 2022-08-27 DIAGNOSIS — F4321 Adjustment disorder with depressed mood: Secondary | ICD-10-CM

## 2022-08-27 DIAGNOSIS — Z566 Other physical and mental strain related to work: Secondary | ICD-10-CM | POA: Diagnosis not present

## 2022-08-27 DIAGNOSIS — F4322 Adjustment disorder with anxiety: Secondary | ICD-10-CM

## 2022-08-27 DIAGNOSIS — F411 Generalized anxiety disorder: Secondary | ICD-10-CM

## 2022-08-27 DIAGNOSIS — Z639 Problem related to primary support group, unspecified: Secondary | ICD-10-CM

## 2022-08-27 NOTE — Progress Notes (Signed)
Virtual Visit via Video Note  I connected with Tara Weaver on 08/27/22 at  9:00 AM EDT by a video enabled telemedicine application and verified that I am speaking with the correct person using two identifiers.  Location: Patient: home Provider: home office   I discussed the limitations of evaluation and management by telemedicine and the availability of in person appointments. The patient expressed understanding and agreed to proceed.   I discussed the assessment and treatment plan with the patient. The patient was provided an opportunity to ask questions and all were answered. The patient agreed with the plan and demonstrated an understanding of the instructions.   The patient was advised to call back or seek an in-person evaluation if the symptoms worsen or if the condition fails to improve as anticipated.  I provided 45 minutes of non-face-to-face time during this encounter.   THERAPIST PROGRESS NOTE  Session Time: 9:00 AM to 9:45 AM  Participation Level: Active  Behavioral Response: CasualAlertAnxious and Depressed  Type of Therapy: Individual Therapy Treatment Goals addressed: Work on coping skills to decrease anxiety, in particular work on coping with job stressors  ProgressTowards Goals: Progressing-working on new source for depression and anxiety loss of brother by suicide processing in patient and identifying what patient needs letting herself grieve setting boundaries with father writing in journal  Interventions: Solution Focused, Strength-based, Supportive, and Other: grief  Summary: Tara Weaver is a 52 y.o. female who presents with getting ready to blow therapist mind. Not doing well at all. On July 4 brother took his life. Had a meeting with his therapist on 9th what can do as family to help he was in a delusional state neighbor trying to get him. Supposed to come to her house on 4th emailed her saying going away for awhile said going out west to [place].  Rereading the email estranged for so many years and he  saying glad reconnected and proud of her. He was the one really rally around her lucky got to be at close as could. Tried to help him Friday 5th Dad said going over to brothers not answering texts or phones. Dad found hanging. Not been a good few weeks. He was trying to get help he pulled out before thought with their relationship would help but hasn't been good at all. Trying to get him inpatient parents leaning on her now. A girlfriend at work who lost father unexpectantly found a grief counselor through hospice had first session yesterday helped a lot and see her next work. Obviously anxiety off the chart. Dad Production designer, theatre/television/film of estate and brother didn't have will Dad adamant about being Production designer, theatre/television/film and patient helping so everything on Alyson here she is again and can't delegate. He wants to be in control but needs help doesn't know how to do this. Had to figure out how to get access to things to get passwords. Told Dad need time to be sad. Just overwhelmed mom has told Dad have to leave her alone. Luckily didn't see her brother. Dad distraught saw him described now has details not sleeping and eating and exhausted. Everyone so supportive of her. They are concerned about her with this responsibility and just getting better 31/2 weeks later then this. Ok and better but knows she needs to let herself be sad and go through emotions to process.  Has appointment today to get brother's computer unlocked. Wants to talk to Dad and just wants 48 hours just a day or two. Daughter's birthday doesn't want that  to take over want to have a good time be in the moment. Husband has been phenomenal done the hard stuff. His background in law enforcement the hard stuff things like identifying her brother, talking to police at the scene he did that very lucky in that sense to be that buffer. This is affecting him too. Shared angry at brother a little putting through the pain  putting through the pain and not being able to take pain away. Understand that and patient says she is not trying to take it out on people. Not angry was brother know he was tired understand intellectually could not fight anymore. Emotions are sad. Wished he would have held on longer to get help. Irritated at people not at brother anxiety because not able to grieve kind to people helps patient understand don't know battles facing understand that more. In line at grocery store agitated quickly nobody knows why because morning loss of brother trying to process that he is gone. Feels had to be the rock for parents figure out bills to help parents know they can't do it haven't been able to stay in bed, cry and fall apart. Shock to business have to get that done. Now that this is done. Moved things out of apartment those big items done now coming down and now needs to be sad. He struggled with mental health issues all life with bipolar and delusions and able to pull out. Doing well at job paranoia crept in. Things he was doing himself fired. Laid off in June the catalyst thinks for it.  Understand why brother did more that the sadness that feels for him that he was dark and didn't see anyway out and felt alone. Breaks her heart understand burdens carrying but said was in that dark place hate that he felt that way.  Wish let her in more even though therapist at a good place in relationship. Miss what could have been for them at same token happy had these six months happiest been in 6 months can't ask for more realistically just sucks. Knows at peace not struggling happy place free and free from demons that makes her feel better. God put her at lowest point glad that they had this time together. He finally realized she is not some super human person and actually struggle wiht stuff flawed and messed up person and everything not perfect in life. Glad had connection.  Start and get a journal write to brother put a date  talking to him slept longer hasn't been sleeping. Felt like a big weight off of her write two pages and getting off her chest. Releases it feel holding in and finally take a breath. Even if doesn't make sense.     Processed with patient at tragedy of brother dying by suicide have left patient feeling like regressing anxiety is significant.  Labeled that patient is going through grieving process natural part of the process discussed going to a grieving counselor therapist noted that is very good coping and encouraging patient with the advice counselor gave setting boundaries with her father allowing herself to grieve the same when she had a set boundaries at work.  Talked about going away this weekend as part of setting those boundaries getting away also giving herself time to be sad.  Discussed for patient she understands from his mental health history why but feel so sad that he was in such a dark place.  Therapist talked about grief feeling we could have done something  but in reality we cannot control things in life out of her control do the best we can with what we can control also the positive the patient did get close to him in the last 6 months something she feels good about.  Noted is well journaling advised by grief counselor helping to get relief getting things off her chest.  Simple exercise of grief is just processing feelings to help work through also talked about the dual process model going back between loss activities and focusing on present some point balance is out with new normal.  Therapist provided space and support for patient to talk about thoughts and feelings in session.  Suicidal/Homicidal: No  Plan: Return again in 2 weeks.2.  Work on grief, strategies for anxiety have been looking at shadow book, chapter from anxiety book on perfectionism, need to control.3.  Therapist added  grief counseling to treatment plan  Diagnosis: Generalized Anxiety Disorder, Adjustment disorder with  anxious mood, work stress  Collaboration of Care: Other none needed  Patient/Guardian was advised Release of Information must be obtained prior to any record release in order to collaborate their care with an outside provider. Patient/Guardian was advised if they have not already done so to contact the registration department to sign all necessary forms in order for [place] to release information regarding their care.   Consent: Patient/Guardian gives verbal consent for treatment and assignment of benefits for services provided during this visit. Patient/Guardian expressed understanding and agreed to proceed.   Coolidge Breeze, LCSW 08/27/2022

## 2022-09-11 ENCOUNTER — Ambulatory Visit (INDEPENDENT_AMBULATORY_CARE_PROVIDER_SITE_OTHER): Payer: BC Managed Care – PPO | Admitting: Licensed Clinical Social Worker

## 2022-09-11 DIAGNOSIS — F411 Generalized anxiety disorder: Secondary | ICD-10-CM | POA: Diagnosis not present

## 2022-09-11 DIAGNOSIS — F4321 Adjustment disorder with depressed mood: Secondary | ICD-10-CM

## 2022-09-11 DIAGNOSIS — F4322 Adjustment disorder with anxiety: Secondary | ICD-10-CM

## 2022-09-11 DIAGNOSIS — Z566 Other physical and mental strain related to work: Secondary | ICD-10-CM | POA: Diagnosis not present

## 2022-09-11 DIAGNOSIS — F439 Reaction to severe stress, unspecified: Secondary | ICD-10-CM
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
## 2022-09-24 ENCOUNTER — Ambulatory Visit (HOSPITAL_COMMUNITY): Payer: BC Managed Care – PPO | Admitting: Licensed Clinical Social Worker

## 2022-10-08 ENCOUNTER — Ambulatory Visit (HOSPITAL_COMMUNITY): Payer: BC Managed Care – PPO | Admitting: Licensed Clinical Social Worker

## 2022-10-08 DIAGNOSIS — F439 Reaction to severe stress, unspecified: Secondary | ICD-10-CM | POA: Diagnosis not present

## 2022-10-08 DIAGNOSIS — F4321 Adjustment disorder with depressed mood: Secondary | ICD-10-CM

## 2022-10-08 DIAGNOSIS — F411 Generalized anxiety disorder: Secondary | ICD-10-CM

## 2022-10-08 DIAGNOSIS — Z566 Other physical and mental strain related to work: Secondary | ICD-10-CM

## 2022-10-08 NOTE — Progress Notes (Signed)
Virtual Visit via Video Note  I connected with Tara Weaver on 10/08/22 at  9:00 AM EDT by a video enabled telemedicine application and verified that I am speaking with the correct person using two identifiers.  Location: Patient: home Provider: home office   I discussed the limitations of evaluation and management by telemedicine and the availability of in person appointments. The patient expressed understanding and agreed to proceed.   I discussed the assessment and treatment plan with the patient. The patient was provided an opportunity to ask questions and all were answered. The patient agreed with the plan and demonstrated an understanding of the instructions.   The patient was advised to call back or seek an in-person evaluation if the symptoms worsen or if the condition fails to improve as anticipated.  I provided 47 minutes of non-face-to-face time during this encounter.   THERAPIST PROGRESS NOTE  Session Time: 9:00 AM to 9:47 AM  Participation Level: Active  Behavioral Response: CasualAlertDepressed  Type of Therapy: Individual Therapy  Treatment Goals addressed: Work on coping skills to decrease anxiety, in particular work on coping with job stressors, identified new treatment goal work on trauma symptoms  ProgressTowards Goals: Progressing-therapist utilize grief interventions trauma interventions to work with patient on loss of her brother  Interventions: Solution Focused, Strength-based, Supportive, and Other: Grief, trauma  Summary: Tara Weaver is a 52 y.o. female who presents with canceled last session dad back surgery had to take him. Went to the see the trauma specialist. He did the havening technique it is like hypnosis. She talked to him about everything her break down at beginning of year slow transition back to work and then brother happened. He asked if she wants to get rid of visuals and patient said yes. He had her go to the place that is the most  traumatic then think about her happy place as you do that take arms hug yourself and then releasing arms and keep going to a count of 20. Then to keep doing that over and over. Then he started talking don't remember everything he said then remember smiling with eyes closed whatever saying in calm voice counting 10 backwards then asking her what do you see how do you feel. His office felt more crisper. Like dark and pull blinds up lightness and free. It was so weird felt like could breath and released everything. Felt so good, so much energy slept like a rock every night no nightmares and anxiety if mind goes to visualize it instead goes to her mind being on boat. It is like hypnotherapy. Wants to extend session to an hour and half. Monday was a bad night tomorrow two months since brother passed. At [facility] and Dad's emotional first time together since he was there at Mother's Day. Dad gave her a box and not ready to look at it broke down and trouble all day and night. So focused on trauma where can't sleep. With sleep deprivation  a lot of feeling of anxiousness the lagging feeling heavy irritation he wasn't there to help her. Went back to their house where he kind of lived didn't sleep picked Dad up and do a bunch of errands to get them set up. Monday to Wednesday got 4 hours of sleep supposed to feel like home and didn't feel that ready to get out of their and get to her own place. It is very hard part of her feels over but not over it. It has been July to September  a lot of time but not much time. Have guilt should have been more forceful in getting help. Not being so tiptoeing around him be careful talking to him not want to ruin closeness with him. Would, should, could thoughts but shouldn't do but doing it anyway feeling irritated being pulled again but in a different way. Before work now it is her parents. Feels like crap going backwards but in a different way. Intellectually know what happened emotionally hard  time accepting it. Grieving sad and cry doesn't know why accepting doesn't even feel real. Hard time accepting that he would do it. How could he have done this? Can't accept that he is gone and this is reality. Breaks her heart for parents she can't make it better. Can't hurry it work is a good distraction stuck to her boundaries. Brother's death keep pushing back at work. Doesn't have time to deal with this stuff not the "clean up person". Working through emotions and personal issues. This situation making her stronger for work. Days get so sad about brother this pain is so horrible how can get through this. Mom is total denial refuses to talk about. Dad is more vocal about it. Family started to reach out. They didn't have a typical relationship estranged a couple of time over the year spoke and have dialogue past few months closet ever been. Don't have typical memories. Hate that miss so much. Wants to have more. Marland Kitchen Has family but not connection to past. When go will be alone. Mourning that because feel alone. In a couple of support groups sometimes so heavy and sad have to step away from it. Needed air under dark cloud of heaviness didn't know if get out need to breath. Biggest thing dealt with her life probably ever will deal with. On another level tolerance where don't care. He supported patient reach out and in corner get through what needed to get through. In this weird way he is still helping her fight through other stuff death making her stronger. Where she needed to be strong where need to be with work he is still helping her.  Therapist agrees with patient the support we can still get from her loved ones.  Noted going to a trauma specialist who is doing special techniques describes very helpful nightmares went away except for day where triggered.  Therapist talked to patient about being exactly where she supposed to be that she has complicated grief so makes it more challenging noted some aspects of her  grief include denial anger depression.  That this may take longer the intensity of the feelings because of the complications.  We may never get over grief but the intensity decreases.  Noted sense of responsibility is a common reaction with grief therapist explained where she has learned to comes from a sense of wanting to control things we cannot control coming to realize there is nothing we can do to change life events we do not have that much power letting go of some of the guilt.  Patient morning and feeling lost for what could have been getting closer losing her brother not having any siblings left also significant losses and take time for that to lighten for her.  Noted focusing as well commonality of loss in life and focusing on what she does have which is her family.  Noted strength and resilience coming from her loss helpful to realize brothers they are helping her despite the pain she is going through.  As it  of steps patient has taken reached out to a friend who also experienced loss by suicide referred her to a therapist who has been very helpful.  Support groups have been too heavy for her so has stepped away from that.  Therapist utilizing grief interventions helping patient to process thoughts and feelings in session. Suicidal/Homicidal: No  Plan: Return again in 2 weeks.2.  Look at over thinking article, utilized trauma and grief interventions  Diagnosis: Generalized Anxiety Disorder, work stress, grief trauma and stress related disorder   Collaboration of Care: Other none needed  Patient/Guardian was advised Release of Information must be obtained prior to any record release in order to collaborate their care with an outside provider. Patient/Guardian was advised if they have not already done so to contact the registration department to sign all necessary forms in order for [place] to release information regarding their care.   Consent: Patient/Guardian gives verbal consent for treatment and  assignment of benefits for services provided during this visit. Patient/Guardian expressed understanding and agreed to proceed.   Coolidge Breeze, LCSW 10/08/2022

## 2022-10-22 ENCOUNTER — Ambulatory Visit (HOSPITAL_COMMUNITY): Payer: BC Managed Care – PPO | Admitting: Licensed Clinical Social Worker

## 2022-11-05 ENCOUNTER — Encounter (HOSPITAL_COMMUNITY): Payer: Self-pay

## 2022-11-05 ENCOUNTER — Ambulatory Visit (HOSPITAL_COMMUNITY): Payer: BC Managed Care – PPO | Admitting: Licensed Clinical Social Worker

## 2022-11-26 ENCOUNTER — Ambulatory Visit (INDEPENDENT_AMBULATORY_CARE_PROVIDER_SITE_OTHER): Payer: BC Managed Care – PPO | Admitting: Licensed Clinical Social Worker

## 2022-11-26 DIAGNOSIS — F411 Generalized anxiety disorder: Secondary | ICD-10-CM

## 2022-11-26 DIAGNOSIS — F439 Reaction to severe stress, unspecified: Secondary | ICD-10-CM

## 2022-11-26 DIAGNOSIS — Z566 Other physical and mental strain related to work: Secondary | ICD-10-CM

## 2022-11-26 DIAGNOSIS — F4321 Adjustment disorder with depressed mood: Secondary | ICD-10-CM

## 2022-11-26 NOTE — Progress Notes (Signed)
Virtual Visit via Video Note  I connected with Tara Weaver on 11/26/22 at 11:00 AM EDT by a video enabled telemedicine application and verified that I am speaking with the correct person using two identifiers.  Location: Patient: home Provider: home office   I discussed the limitations of evaluation and management by telemedicine and the availability of in person appointments. The patient expressed understanding and agreed to proceed.   I discussed the assessment and treatment plan with the patient. The patient was provided an opportunity to ask questions and all were answered. The patient agreed with the plan and demonstrated an understanding of the instructions.   The patient was advised to call back or seek an in-person evaluation if the symptoms worsen or if the condition fails to improve as anticipated.  I provided 52 minutes of non-face-to-face time during this encounter.  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 female who presents with working with somebody who does havening needs to do that again needs good energy hates work. Here is the problem she was easing into  work then brother happened two weeks in office in transition setting boundaries, brother happened, turned everything upside down. Now back at work going back to way it was before, describes it like a train taking off trying to slow it down and doesn't know how. Brother's estate now done, now grieving, work so busy trying to set boundaries doesn't care doesn't want to do it anymore. Lost transition, boundaries doesn't care, people plowing through her boundaries. Doesn't want to do job anymore, totaling going backwards, doesn't want to the decision to leave at this point since so emotional.  Therapist agrees a sign she does not care not at a place of making big decisions.  Whatever patient does others push and fighting through boundaries, doesn't want to go back to the way she was, "a big mess". Don't want to do it, says to co-workers I'm done, I don't  know. Don't care mess up so fire her. Don't care if don't get it done. Priorities change after something like brother' happens this is just not priority tired of fighting battles co-workers are like like wild animals and they are running over boundaries. . Pros/Cons of quitting-doesn't want to feel stuck there doesn't know why feel stuck. Could get another job. Am feeling like this because overwhelmed with emotions or need another job? Keep saying wait to the end of year. Nobody at work knows what happened. Things back to way were pushing back when try to create boundaries they can't see it other than patient not being a nice person to set a boundary, they don't see that they are responding to her like she was the old Amesti. Things at work were split off delegate but other things have taken their place  [name] has ADHD can't stay focused run after him all she does to keep him in line. Futile and his direct reports frustrated can't keep him focused all over the place. Patient is setting boundaries by saying "got nothing for you", I'm done. People frustrated but patient  says "I'm done". In a survival mode. Survivor mode nobody respecting her boundaries have to tell them figure it out. Gave an example asked her something about HR stuff not her job. . Senior vice president came to her to ask about another group not doing the office set up that the other group is doing. Explored how patient can respond that she is not the boss and patient responded by saying don't know what to tell you. Literally wants to type on computer do what need to do and go home. Working with other therapist recognizing she is in a leader role working on fulfilling that role patient  doesn't want to be a leader why can't they do their job. Can't hand hold too much stress too much on plate if told them would they leave her alone?  See where she is coming from. Would still come to her. Looking for somebody make a decision and that is patient who speaks up. Now not doing that don't know, don't know what to tell you. Gets frustrated when says something and they don't hear it. Bad shouldn't be like this in her role but over it. Right now everyone on own coasting and they have to figure it out. Has to be extreme because nothing else working. Allow herself to go through milestones, give herself grace not want to make a rash decision, heightened emotions patient said allow herself to feel feelings. Doesn't want to go to beach happy place no comfort in going no desire to go doesn't know why. Guilty maybe.  Therapist noted which she is now will change want always be like this Working on celebration of life dealing with parents mom won't leave the house unless doctor's and grocery. Group on Facebook doesn't want to sit in a circle and talk [facility] Surviv[facility] but helps to listen. Have a newsletter, specific for siblings parents.  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 Past 10 months would like to erase. Last few sessions with other therapist dealing being a leader doesn't want to do part of  job defiant child. Doesn't want to be ok. Just need to be in feelings once in feelings sad, depressed upset. For so long bury feelings strong for others. Need to heal. Otherwise end up like was before. Move away from that person focus on herself other people don't get it let her be sad, depressed. Worry about her being in a dark depression used to keep feelings under cover not used to seeing her like this vulnerable.     Patient struggling with her job right now not happy boundaries not working with people does not really care at this point after loss of her brother.  Therapist put this in context that she is grieving so naturally nothing seems to matter make sense she would be in this place.  Noted that we will involve things will change gave her some resources that we will describe this help her with coping Option B, Finding Meaning.  Needs to have these feelings to help her heal noted she is in depression.  Showed her dual process model to show her how we go back and forth with task between loss oriented restoration oriented and  as she does more restoration oriented we will show her she is moving toward healing she will get better but never be the same.Marland Kitchen  Positive she is in a support group that is helpful noted she cannot be other than she is she is in a leadership role but at this point letting people know can help them does not know what to say is a way setting a boundary when no one accepts any other type of boundary for now.  Therapist used that she is a phrase "it is okay not to be okay" actually being in her feelings is healthy it is part of the healing process for grief.  Therapist provided support and space for patient to talk about thoughts and feelings in session Suicidal/Homicidal: No  Plan: Return again in 4-5 weeks. (Earliest appointment so put on cancellation list) 2.  Work on grief patient look at resources "option B" "Finding Meaning, focus on anxiety can look at resources for overthinking by  doing a I search and looking at [facility] article  Diagnosis: Generalized Anxiety Disorder, work stress, grief trauma and stress related disorder  Collaboration of Care: Other none needed  Patient/Guardian was advised Release of Information must be obtained prior to any record release in order to collaborate their care with an outside provider. Patient/Guardian was advised if they have not already done so to contact the registration department to sign all necessary forms in order for [place] to release information regarding their care.   Consent: Patient/Guardian gives verbal consent for treatment and assignment of benefits for services provided during this visit. Patient/Guardian expressed understanding and agreed to proceed.   Coolidge Breeze, LCSW 11/26/2022

## 2023-01-08 ENCOUNTER — Ambulatory Visit (HOSPITAL_COMMUNITY): Payer: BC Managed Care – PPO | Admitting: Licensed Clinical Social Worker

## 2023-01-08 DIAGNOSIS — F4321 Adjustment disorder with depressed mood: Secondary | ICD-10-CM

## 2023-01-08 DIAGNOSIS — F411 Generalized anxiety disorder: Secondary | ICD-10-CM

## 2023-01-08 DIAGNOSIS — F439 Reaction to severe stress, unspecified: Secondary | ICD-10-CM

## 2023-01-08 DIAGNOSIS — Z566 Other physical and mental strain related to work: Secondary | ICD-10-CM

## 2023-01-08 NOTE — Progress Notes (Signed)
Virtual Visit via Video Note  I connected with Tara Weaver on 01/08/23 at  4:00 PM EST by a video enabled telemedicine application and verified that I am speaking with the correct person using two identifiers.  Location: Patient: home Provider: home office   I discussed the limitations of evaluation and management by telemedicine and the availability of in person appointments. The patient expressed understanding and agreed to proceed.   I discussed the assessment and treatment plan with the patient. The patient was provided an opportunity to ask questions and all were answered. The patient agreed with the plan and demonstrated an understanding of the instructions.   The patient was advised to call back or seek an in-person evaluation if the symptoms worsen or if the condition fails to improve as anticipated.  I provided 50 minutes of non-face-to-face time during this encounter.  THERAPIST PROGRESS NOTE  Session Time: 4:00 PM to 4:50 PM  Participation Level: Active  Behavioral Response: CasualAlertappropriate  Type of Therapy: Individual Therapy  Treatment Goals addressed: Work on coping skills to decrease anxiety, in particular work on coping with job stressors, identified new treatment goal work on trauma symptoms  ProgressTowards Goals: Progressing-patient back on track with skill of setting boundaries has to do for her own wellbeing, therapist providing supportive strength-based intervention noting difficulty of grieving for her brother therapist assesses helpful to have an outlet where she can talk about it  Interventions: Solution Focused, Strength-based, Supportive, and Other: coping  Summary: Tara Weaver is a 52 y.o. female who presents with went to see person who does havening  needed reset. Shared with him bummed explaining to him missed brother at Thanksgiving one holiday participated miss him proud mom for hosting. He encouraged her to write letter. Patient had to  remind him can't call took his life in July. Talking for 15 minutes hard for holidays been crying. Patient asks does that happen forget why somebody seeing you? Both of [place] laughed about this. Does that journal when have sad and deep moments write in journal write to him. Thanksgiving sucked. Hypnosis helped could tell going backwards funk 0 desire to do something force but don't enjoy it. Doesn't know if won't allow. Don't know if won't allow because of guilt. Celebration planning it is patient the one who does everything.  Both patient and therapist think will be helpful rituals helpful patient said part will be becoming more real. Once have the celebration think help more acceptance. Right now not doing anything dragging it out lingering.  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  Patient explains there is a trimetrics summary of behavior. characteristics for patient says she is goal oriented, keep people on task, likes people but can be seen as cold and blunt. Outlier. Needs to relax and pace herself. Establish standard high ego strengths means meets her demands for herself  met. For her she prefers brief conversations needs to ask more questions, needs more patience to slow acting people, May display a lack of empathy not meet her standards needs to avoid being pushy and critical. Talked to girl doesn't feel included patient said in new role delegate responsibilities but didn't have a chance to allow it to happen. Two weeks later her brother died. Not a point where patient can say what can help with her. Didn't get to put in to practice what we talked about in therapy because two weeks and that happened with brother. Patient explains difference between kind/nice kind a  deeper level of caring and consideration. Kind leaders environment more supportive, nice which is more superficial. Polite not to offend, patient working on and teaching her to be a leader that is kind and not  nice.  Talked to a girl telling her not delegating more can't be reliable. She took it well.  Therapist and patient both think this was kind leadership helpful for her to know areas she needs to work on now giving her small things. Work on continuing to keep boundaries doesn't want to go backward people coming to her have to push back and say no. Need to build confidence in setting boundaries not where don't care.  Therapist reminded patient of the strategy "don't know what to tell you" as a strategy to set a boundary have to or people push her.  Talk to her boss explained does not want to get more involved patient asks when say no said combative has to stick to guns on this, hard, if not get tired mentally. Don't want to deal with it want a recluse. Focus on her and job not on everything else. Protective of herself have to put herself first. Priorities herself and family. Explains losing her brother mind shift completely had two big things. Have to respect boundaries firm with that. Always been the nice person. Need to be  creating boundaries if not go back to way was. If people feelings hurt oh well.   Not looking forward to holidays brother did not participate in Christmas anxiety not as different but different sorrow for parents. Don't talk about it. Look at them breaks her heart for them old school traditional early on didn't want to say something wrong. Wonder they have regret. Open with brother but they didn't know what to do to get him help, patient says carries their grief. Want to do right by him acknowledge and give him recognition talented want people to know to celebrate, fun things representative of him. Was into into arts.  He was talented excited to celebrate his greatness so happy and proud of doing this wish happen when here people to see  how talented he was. Big event.  He wanted to be acknowledged therapist noted maybe is here with this although at the same time wanting to validate how hard it  is that he is not here and having the celebration.  Process where patient is with things noted we have been working on her setting boundaries with just getting back to work where how to deal with brother who died by suicide never got herself on track.  Working again on skills setting boundaries being assertive knows if she does not do that she will be right back to where she was before.  Therapist equated it to engaging in healthy behaviors that helps with healthy coping.  Patient shared some of the facets where she got pushed back but positive that did not get her rattled.  Dealing with difficulty of losing brother around holidays patient is putting a lot of effort into a celebration wants to make sure he is recognized known for his talents feels it is her obligation therapist noted also will help with grieving to have these rituals and she agrees.  Session was good for patient she shared some funny stories in both of [place] had a good laugh therapist thinks the best therapy of all.  Writer provided empathetic listening direct questioning discussed current and underlying problems which is still dealing with grief from brother as well as working  on setting boundaries the skills she has to work on, Equities trader problem solving and elaborating on realistic coping skills.  Reviewed treatment plan patient gave consent to complete virtually.  Suicidal/Homicidal: No  Plan: 1.put on waiting list because appointment is so far out. 2.  Work on overthinking can look at [employer] article, [employer] discussed any books read that were helpful such as finding meaning .  Diagnosis: Work on Pharmacologist to decrease anxiety, in particular work on coping with job stressors, identified new treatment goal work on trauma symptoms  Collaboration of Care: Other none needed  Patient/Guardian was advised Release of Information must be obtained prior to any record release in order to collaborate their  care with an outside provider. Patient/Guardian was advised if they have not already done so to contact the registration department to sign all necessary forms in order for [place] to release information regarding their care.   Consent: Patient/Guardian gives verbal consent for treatment and assignment of benefits for services provided during this visit. Patient/Guardian expressed understanding and agreed to proceed.   Coolidge Breeze, LCSW 01/08/2023

## 2023-01-23 ENCOUNTER — Ambulatory Visit (HOSPITAL_COMMUNITY): Payer: BC Managed Care – PPO | Admitting: Licensed Clinical Social Worker

## 2023-01-23 DIAGNOSIS — F439 Reaction to severe stress, unspecified: Secondary | ICD-10-CM | POA: Diagnosis not present

## 2023-01-23 DIAGNOSIS — F411 Generalized anxiety disorder: Secondary | ICD-10-CM | POA: Diagnosis not present

## 2023-01-23 DIAGNOSIS — F4321 Adjustment disorder with depressed mood: Secondary | ICD-10-CM | POA: Diagnosis not present

## 2023-01-23 DIAGNOSIS — Z566 Other physical and mental strain related to work: Secondary | ICD-10-CM

## 2023-01-23 NOTE — Progress Notes (Signed)
Virtual Visit via Video Note  I connected with Tara Weaver on 01/23/23 at  8:00 AM EST by a video enabled telemedicine application and verified that I am speaking with the correct person using two identifiers.  Location: Patient: home Provider: home office   I discussed the limitations of evaluation and management by telemedicine and the availability of in person appointments. The patient expressed understanding and agreed to proceed.  I discussed the assessment and treatment plan with the patient. The patient was provided an opportunity to ask questions and all were answered. The patient agreed with the plan and demonstrated an understanding of the instructions.   The patient was advised to call back or seek an in-person evaluation if the symptoms worsen or if the condition fails to improve as anticipated.  I provided 46 minutes of non-face-to-face time during this encounter.  THERAPIST PROGRESS NOTE  Session Time: 8:00 AM to 8:46 AM  Participation Level: Active  Behavioral Response: CasualAlertappropriate  Type of Therapy: Individual Therapy  Treatment Goals addressed: Work on coping skills to decrease anxiety, in particular work on coping with job stressors, identified new treatment goal work on trauma symptoms  ProgressTowards Goals: Progressing-patient in a group better at setting boundaries this indication of progress, discussed brother and holidays noted having a celebration patient herself points out is helping the healing process start  Interventions: Solution Focused, Strength-based, Supportive, and Other: grief  Summary: Tara Weaver is a 52 y.o. female who presents with after 10 AM on break until January 6. Plans for Christmas include her parents daughter and husband come over super low key. Ready to be over un-decorate go back to normal. Brother's memorial focused on that. In [place] where he grew up and where they are from. Went to [employer] in [place] it will be at [employer] center rented non profit, for people to play, have events, musicians come best friend there, will show two films he made won two awards make a nice causal fun thing. An arty community. That vibe there.Wrangler and Nedra Hai put his designs on t-shirts will all wear a different t-shirt. Dad wants to have something in [employer] church brother not so much into that. Dad very much [employer] important to him. So will probably have something small there. Work is going good getting into a groove before trying to figure it out and where she is at. Work in progress of course boundaries better saying no more. Feel good about it not afraid to say no, saying I need you to do it instead. Not feeling guilty that was feeling. Confident doing that. Enjoy down time during the day if busy day not self-induced not creating the problem. Therapist noted sounds good and patient said we'll see. Parents and daughter eat and go home and relax. Low key every one will relax everyone go home. Find a movie and watch it an ideal thing for her to do.  Has to get some gifts has to push herself to go out and then fine. Therapist asked whether their holiday will include about talking about him not sure Christmas he didn't participate with them.  They did at things giving that was his favorite holiday.  Weird way not different he wasn't there. Regular Christmas never participated. Going back to acceptance only participated in Thanksgiving. Thinks of it  like he is still in [place] nothing changed he is in [place]. Last weekend found out things not good bad really bad. Annoyed irritated emotion shifted in a  way. Can't be sad anymore, don't want to be sad. Weird feeling how could you be this way totally turned off. Sadness buried because of it. Somebody finds things about them never knew some parts are good and some parts bad. Disappointing .does not know if it is an excuse but things part of the disease and  therapist agrees with this. This had to be brought to attention not being malicious. Something had to be discussed not fault the person who told her. If he was alive would be so pissed off. Depression out of this, with this  information in the loop annoyed, irritated,  disappointed, angry pulled out of cycle of overwhelming grief, another hole of anger, not glad but glad that is what it took to get her out of dark sadness didn't know how but did that.      She thinks memorial in face acknowledging it publicly not privately mourning and trying to understand. Healing will start verbalizing what happened and verbalize to people process shock,grief and sadness. The private cocoon of them break out come out of and acknowledge. It. Mom says just existing for now. This thing forced to acknowledge it.  Patient talked about difficulty of losing parents when they gone nobody. No siblings to lean on and get life through. Think of memories childhood memories with somebody is gone and wiped away.  Little bit of anger when here didn't really help with parents didn't have a strong relationship now talking care of parents a lot. Travel with them to [place] with health issues. Using tools to say mom ok doesn't matter. Trying to get Mom out of what if's. Proactive with them ask and patient used to saying "yup". Can't wait for it to be over Dad wants to be in control and patient taken over but sees she is just like parents. Brother's estate to take reins.   Talked about dynamic with parents things will be driven by made them happy until she is gone.  Achievement sense of value if parents not proud means nothing. Never go away until die.  Therapist challenged patient recognizes the challenges well finding her own voice although the dynamic remains strong.   .     Reviewed with patient how Christmas will be patient explains things it will be normal her was not around in [place].  Discussed celebration things will help them  bring their morning public will help them start the healing process.  Talked about her sadness about losing brother does not have somebody to share memories with a feeling of being all alone.  Therapist validated that with patient but also minded patient of how much trauma there can be in family's so to remember that and she is aware.  Patient shared finding something out about her brother so her grief is gone from depression to anger therapist noting this fits the grief model of different motions you can have in ways being angry helps her to get out of the hole of depression which she did not know how she was getting get out of an anger may have some relief that depression does not she agreed or outward focused.  Patient talked about looking forward to relaxing during the holidays, keeping things low-key, stress managing parents ongoing talking about that in context of the celebration mom who is worrying about things that he likes to take control.  Patient mention an attitude of acceptance and discussing different topics about brother therapist noted acceptance as a helpful coping as well.  Therapist provided support and space for patient to talk about thoughts and feelings in session.    .   Suicidal/Homicidal: No  Plan: Return again in 6 weeks.2.  We can look at AI help to work on overthinking also article Healthline review [facility] from [facility], talk about any books helpful such as finding meaning  Diagnosis: Generalized Anxiety Disorder, work stress, grief trauma and stress related disorder   Collaboration of Care: Other none needed  Patient/Guardian was advised Release of Information must be obtained prior to any record release in order to collaborate their care with an outside provider. Patient/Guardian was advised if they have not already done so to contact the registration department to sign all necessary forms in order for [place] to release information regarding their  care.   Consent: Patient/Guardian gives verbal consent for treatment and assignment of benefits for services provided during this visit. Patient/Guardian expressed understanding and agreed to proceed.   Coolidge Breeze, LCSW 01/23/2023

## 2023-03-04 ENCOUNTER — Ambulatory Visit (HOSPITAL_COMMUNITY): Payer: BC Managed Care – PPO | Admitting: Licensed Clinical Social Worker

## 2023-03-04 DIAGNOSIS — F439 Reaction to severe stress, unspecified: Secondary | ICD-10-CM | POA: Diagnosis not present

## 2023-03-04 DIAGNOSIS — Z566 Other physical and mental strain related to work: Secondary | ICD-10-CM

## 2023-03-04 DIAGNOSIS — F411 Generalized anxiety disorder: Secondary | ICD-10-CM | POA: Diagnosis not present

## 2023-03-04 DIAGNOSIS — F4321 Adjustment disorder with depressed mood: Secondary | ICD-10-CM | POA: Diagnosis not present

## 2023-03-04 NOTE — Progress Notes (Signed)
Virtual Visit via Video Note  I connected with Tara Weaver on 03/04/23 at  2:00 PM EST by a video enabled telemedicine application and verified that I am speaking with the correct person using two identifiers.  Location: Patient: home Provider: home office   I discussed the limitations of evaluation and management by telemedicine and the availability of in person appointments. The patient expressed understanding and agreed to proceed.   I discussed the assessment and treatment plan with the patient. The patient was provided an opportunity to ask questions and all were answered. The patient agreed with the plan and demonstrated an understanding of the instructions.   The patient was advised to call back or seek an in-person evaluation if the symptoms worsen or if the condition fails to improve as anticipated.  I provided 45 minutes of non-face-to-face time during this encounter.  THERAPIST PROGRESS NOTE  Session Time: 2:00 PM to 2:45 PM  Participation Level: Active  Behavioral Response: CasualAlertappropriate  Type of Therapy: Individual Therapy  Treatment Goals addressed: Work on coping skills to decrease anxiety, in particular work on coping with job stressors, identified new treatment goal work on trauma symptoms  ProgressTowards Goals: Progressing-patient had ceremony for her brother in [place] helpful for family and grieving ongoing therapy helpful for patient terms of mood processing thoughts and feelings  Interventions: Solution Focused, Strength-based, Supportive, and Other: grief  Summary: Tara Weaver is a 53 y.o. female who presents with glad to be back. Got back Monday night. Voice hoarse because it was exhausting. Loves her parents but traveling with 27 year old parents who haven't flowed in 35 years, brothers remains and husband and daughter was challenging. Patient is a good planner tried hard worked out well TSA amazing they were compassionate had wheelchair  assistance. Anxiety at all time high worried miss something. Patient carried his remains put in carry on met a security did pre-check went through security and escorted her security told scanner wiped off the urn. They were so nice asked his name said they would take care of them, hugged them and said so sorry for their loss couldn't have a better experience. Prepping for the events that went well 50/60 people came perfect causal but put together well everyone had good stories sad but as Mom said was so comforted. They needed this. People were so comforting. A little bit of closure. So proud of Mom she did so well at [facility] saw friends and family, life long friends laughed but then upset as well overwhelmed. Both patient and Mom upset on plane both thought over and done. Dad said not done not forget patient describes what it is for her closure of this loop of unresolved feelings. Unresolved grief. Listening to how cared for how they loved hm. He was held in high regard by these people influenced people, lives, careers didn't have any idea. Comfort mom saying lived a good life huge knew about his mental health and struggles. Questions were there any happy moments that there was a lot of happy positive times lived a good life couldn't deal with the mental health piece. [place] good socially not good for his mental well being isolated and paranoid. Escalated and the lock downs with COVID. To the point Dad told him to check in to a mental hospital, sell stuff and be with family. Like a disease like cancer he thought better stopped medicines he lost his insurance sad for anybody who suffers with that. Brother lucky had support of parents a  lot of people don't have that support put out to pasture and whatever.  Therapist agreed these toward the end patient explained what was happening got angry at parents created false beliefs he though he was being excluded.  Last Wednesday had Richard to go to hypnosis he doesn't  like to fly 21/2 hours there he was golden on the plane. When woke remember why came to see me know but couldn't verbalize. It worked.  Both therapist and patient laughing because it seems like magic. [place] stressful for four days. Mom can hardly walk, cane and has surgery this April, Dad has back surgery both can't hear won't wear hearing aids and won't keep their hearing aids in. Survived and glad to be back. Work is now safe space.  Service-make her feel not so alone. Whenever together feeling the same loss and tragedy comforting sigh of relief not carrying burden alone. Support groups great doesn't provide close knit feelings. Can let go hold on to being close. Something slip through hands can't grasp instead of holding sand finally can let it go and it is ok now. Ok to fall through hands and be ok with it. Showed her family his life had had meaning one side of mental illness and torment a lot of positive made her feel better as a whole. Best friend slide made show crazy how influence so many people in the art world and culture knew things but this was impressive. Made things better. There was food, all got up and said stuff. Told different stories has pictures has to put together.   Therapist able to review recent events and symptoms noted how helpful it was that she had a commemorate's for her brother in [place] was good for her and parents.  It was good to see how he was loves the Legacy he left can see his life was not just suffering but arts of his life he really enjoyed and made a difference impacted people other people who mess him and love him to.  The ceremony was helpful and that way that they were in it together able to provide comfort to each other not caring the burn alone patient gave image of sand being able to let it go helpful in terms of their grieving starting the healing process.  Therapist provided perspective not sure how will happen with her brother but the narrative changes in  enriches and grows in that way over time that being positive.  Positive that patient can connect with his friends their family share positive memories of her brother and be happy about that part.  Patient mention work as a safe place which was very humorous given it was the thing that stressed her out but good where she is with that therapist noted experiences can change our perspective she was exhausted on her trip now glad to be back and that includes back at work.  Talked about hypnotist how helpful it has been to her and husband therapist impressed may utilize for referrals.  Patient pointed out at end of session mood enhanced from laughing different subjects therapist also experienced the same thing assess positive for mental health to laugh while doing therapy.  Suicidal/Homicidal: No  Plan: Return again in 2We can look at AI help to work on overthinking also article Healthline review information from [facility], talk about any books helpful such as finding meaning weeks.  Diagnosis: Generalized Anxiety Disorder, work stress, grief trauma and stress related disorder  Collaboration of  Care: Other none needed  Patient/Guardian was advised Release of Information must be obtained prior to any record release in order to collaborate their care with an outside provider. Patient/Guardian was advised if they have not already done so to contact the registration department to sign all necessary forms in order for [place] to release information regarding their care.   Consent: Patient/Guardian gives verbal consent for treatment and assignment of benefits for services provided during this visit. Patient/Guardian expressed understanding and agreed to proceed.   Coolidge Breeze, LCSW 03/04/2023

## 2023-03-19 ENCOUNTER — Ambulatory Visit (HOSPITAL_COMMUNITY): Payer: BC Managed Care – PPO | Admitting: Licensed Clinical Social Worker

## 2023-03-19 DIAGNOSIS — F4321 Adjustment disorder with depressed mood: Secondary | ICD-10-CM

## 2023-03-19 DIAGNOSIS — Z566 Other physical and mental strain related to work: Secondary | ICD-10-CM | POA: Diagnosis not present

## 2023-03-19 DIAGNOSIS — F439 Reaction to severe stress, unspecified: Secondary | ICD-10-CM | POA: Diagnosis not present

## 2023-03-19 DIAGNOSIS — F411 Generalized anxiety disorder: Secondary | ICD-10-CM | POA: Diagnosis not present

## 2023-03-19 NOTE — Progress Notes (Signed)
Virtual Visit via Video Note  I connected with Tara Weaver on 03/19/23 at 11:00 AM EST by a video enabled telemedicine application and verified that I am speaking with the correct person using two identifiers.  Location: Patient: office Provider: home office   I discussed the limitations of evaluation and management by telemedicine and the availability of in person appointments. The patient expressed understanding and agreed to proceed.   I discussed the assessment and treatment plan with the patient. The patient was provided an opportunity to ask questions and all were answered. The patient agreed with the plan and demonstrated an understanding of the instructions.   The patient was advised to call back or seek an in-person evaluation if the symptoms worsen or if the condition fails to improve as anticipated.  I provided 49 minutes of non-face-to-face time during this encounter.  THERAPIST PROGRESS NOTE  Session Time: 11:00 AM to 11:49 AM  Participation Level: Active  Behavioral Response: CasualAlertappropriate  Type of Therapy: Individual Therapy  Treatment Goals addressed: Work on coping skills to decrease anxiety, in particular work on coping with job stressors, identified new treatment goal work on trauma symptoms  ProgressTowards Goals: Progressing-worked on processing thoughts and feelings to help with stressors with job not taking things so seriously is helpful, worked on traumatic grief as an adjunct to patient and her havening  Interventions: Solution Focused, Strength-based, Supportive, and Other: grief  Summary: Tara Weaver is a 53 y.o. female who presents with people are testing her doesn't understand people how do they function and how do you have a job. Today people are stupid, today especially they don't have common sense. Trying to have patience, trying to not control everything but it is so hard when try to delegate. Ask herself how surviving these people  stupid questions. After lunch  another town hall virtual high level people she found out can't answer questions because presenters.  Therapist felt good point I think it and just be there and not answer questions.  Patient says god is testing her. Went to the other therapist did the havening technique felt going backwards thinking about brother, thought process couldn't remember anything can't get mind right even getting to the appointment drove the wrong way. She explains havening to therapist get you mind aligned, her brain and feelings they were disconnected dead but feeling couldn't get a hold of her feelings breaking out crying. Every time mind thinks about the bad stuff a gesture and takes her back to happy place. If mind drifting and can't focus gesture and go to beach. It is about controlling mind ok with feelings but getting stuck again, deep depression, guilt, patient realizes it is ok to have but not get bogged down. Happening again. Wiping the bad intrusive thoughts away showed the hand gestures which is to hug herself then let arms slowly drop or could  wring hands mind trickery. First she has to close her eyes saddest heaviest thoughts then she cries and then he has her take these thought think about what brings you job relaxed, happiest. I want you to think about that. Patient thought about the beach. Then on three touch you thumb and finger and think of the place he counts and says some words. Open eyes and come back to the room. You can do techniques like if tense make a triangle with fingers. She wraps arms around her and have good thoughts get rid and wipe out the bad ones. He said muscle memory. In a  way hypnotize ourselves it is muscle memory go through and not think have to practice. If gesture mind goes to a better place. More do it better at it because it is muscle memory.  Hard to get out of these thoughts patient is overthinking coming back from trip realization again focus in and obsessed  about it time moving on feels yesterday but so long ago her trying to hold on. Talked about traumatic experience the more time goes on the father away they are. He was just here but last week now 7 mths and not here. Wonders about certain aspects of her relationship may account for difference with parents. Parents are worried about her. Could be that they had a close relationship with brother they were in midst of it with his mental health while patient was more removed feel maybe they accepted more than she has but really not sure. Feels stuck should be further along than she is. Looked at an atricle on prolonged grief. The author uses clock of metaphor external triggers, internal triggers. Week leading up to the 5th can't sleep over emotional in subconscious in hard drive in computer is how it was explained to her. That was the week when lost her brother. With havening trying to rewrite don't hold on to that. Subconsciously coming up at that time, makes sense having the feelings again.  Threw her seeing his apartment was on the floor board crying. They were going to talk to someone about taxes. Husband felt so bad just need a few minutes got herself together sat there. She got herself together. Took away how feeling when saw how much owe federal government.  Talked about getting another job the positive would be not as demanding and stressful. She says probably ride it out her current job until win lottery. Can see the positives in her job flexible, good vacation time more good than bad. Was taught about creating boundaries in therapy has been sticking to them and push forward come along way ways to go. As many people that frustrate her has a core group who support her. Strong foundation and keep her grounding make sure that not put in position was before. Value her enough in her wellness that they want to keep her around. Patient very grateful. Not a lot companies would do that. They tell her she is doing  exactly doing what supposed to do good to have checks didn't have it before and imploded. She has boundaries and they make stay she sticks with that.  Talked about Valentine's Day she rode her husband a letter how much overcome how much love and appreciate his support.  Both she and therapist think this is going to be very meaningful  Patient provided more information on the evening and therapist fascinated learning that it is working on changing her subconscious therapist finds that if it is effective the patient says that is this is tremendously helpful things are always stored in the some conscious and hard to get to this technique can help her with symptoms.  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  And some of the symptoms patient has which she endorses things such as Feeling stunned dazed or shocked by the death avoid reminders of the reality of the loss have trouble excepting the death feel bitterness or anger related to the  loss experience difficulties moving on with life confusion about once role in life or diminished sense of self feeling empty therapist is seen patient goes through some of these feelings and patient also agrees therapist sees in general better place but she herself could tell she was getting stuck and needed to go to a session to help change that.  Therapist also noted skin be ways back-and-forth patient can see how she is externally triggered can happen out of the blue.  Internally triggered as well therapist likes the article and it says how were working on a different relationship with the deceased therapist likes the fact we maintain a sense of relationship also liked how it talks about mastering grief working to heal will have positive ripple effects.  Therapist talked about her strategy with clients  is to get the mount of being stuck as well but they are doing it more cognitively might be more of a challenge therapist said we cannot change our thoughts but we work on how we react to them so we get out of unhelpful rumination.  Patient has the overthinking so has to work on that helps to be able to have a process that helps her get unconscious to make changes to relieve symptoms.  Therapist provided support and space for patient to talk about thoughts and feelings in session.  Positive the patient has support group at work and appreciates them keep her grounded help her in maintaining her boundaries also noted the progress she has made since starting therapy of maintaining boundaries.  Suicidal/Homicidal: No  Plan: Return again in 2 weeks.2.We can look at AI help to work on overthinking also article Healthline review information from [facility], talk about any books helpful such as finding meaning. 3.Look at prolonged grief article  Diagnosis: Generalized Anxiety Disorder, work stress, grief trauma and stress related disorder  Collaboration of Care: Other none needed  Patient/Guardian was advised Release of Information must be obtained prior to any record release in order to collaborate their care with an outside provider. Patient/Guardian was advised if they have not already done so to contact the registration department to sign all necessary forms in order for [place] to release information regarding their care.   Consent: Patient/Guardian gives verbal consent for treatment and assignment of benefits for services provided during this visit. Patient/Guardian expressed understanding and agreed to proceed.   Coolidge Breeze, LCSW 03/19/2023

## 2023-04-02 ENCOUNTER — Ambulatory Visit (INDEPENDENT_AMBULATORY_CARE_PROVIDER_SITE_OTHER): Payer: Self-pay | Admitting: Licensed Clinical Social Worker

## 2023-04-02 ENCOUNTER — Encounter (HOSPITAL_COMMUNITY): Payer: Self-pay

## 2023-04-02 DIAGNOSIS — Z91199 Patient's noncompliance with other medical treatment and regimen due to unspecified reason: Secondary | ICD-10-CM

## 2023-04-02 NOTE — Progress Notes (Signed)
 Patient late cancel pulled into a meeting

## 2023-04-16 ENCOUNTER — Ambulatory Visit (HOSPITAL_COMMUNITY): Payer: BC Managed Care – PPO | Admitting: Licensed Clinical Social Worker

## 2023-04-16 DIAGNOSIS — F411 Generalized anxiety disorder: Secondary | ICD-10-CM

## 2023-04-16 DIAGNOSIS — F4321 Adjustment disorder with depressed mood: Secondary | ICD-10-CM | POA: Diagnosis not present

## 2023-04-16 DIAGNOSIS — F439 Reaction to severe stress, unspecified: Secondary | ICD-10-CM | POA: Diagnosis not present

## 2023-04-16 DIAGNOSIS — Z566 Other physical and mental strain related to work: Secondary | ICD-10-CM | POA: Diagnosis not present

## 2023-04-16 NOTE — Progress Notes (Signed)
 Virtual Visit via Video Note  I connected with Tara Weaver on 04/16/23 at  9:00 AM EDT by a video enabled telemedicine application and verified that I am speaking with the correct person using two identifiers.  Location: Patient: home Provider: home office   I discussed the limitations of evaluation and management by telemedicine and the availability of in person appointments. The patient expressed understanding and agreed to proceed.   I discussed the assessment and treatment plan with the patient. The patient was provided an opportunity to ask questions and all were answered. The patient agreed with the plan and demonstrated an understanding of the instructions.   The patient was advised to call back or seek an in-person evaluation if the symptoms worsen or if the condition fails to improve as anticipated.  I provided 46 minutes of non-face-to-face time during this encounter.  THERAPIST PROGRESS NOTE  Session Time: 9:00 AM to 9:46 AM  Participation Level: Active  Behavioral Response: CasualAlertappropriate  Type of Therapy: Individual Therapy  Treatment Goals addressed: Work on coping skills to decrease anxiety, in particular work on coping with job stressors, identified new treatment goal work on trauma symptoms  ProgressTowards Goals: Progressing-in general assess helpful for patient to talk it out to help with coping around grief issues around stress at work reviewed strategies for perfectionism bodily interventions to help with anxiety  Interventions: CBT, Solution Focused, Strength-based, Supportive, and Other: coping  Summary: Tara Weaver is a 53 y.o. female who presents with going to beach to their condo in [place]. Forcing herself e to take off work getting to a point it is ridiculous take time and relax. Talked about video shared about her brother.. Felt privileged to see. Patient says it was done well haven't cried. On March 5 7 months since  he passed, didn't have anxiety leading up didn't cry or get upset.Subconscious know the date usually anxious leading up to it could be signs of healing also work crazy and huge distraction. Able to control process better with grieving if find going down that dark hole it does bring her close to him but better able to control it more able to divert herself from going down the dark hall. Thinks it is work as well that helps divert ridiculous and thinks going backwards with the boundaries had to pull back and went to boss and said too much and back out and somebody else needs to handle it. Meeting yesterday can't go stay in office focus on him so many things coming up. 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 Tara Weaver is being very supportive when sets boundaries he asked do you need a team patient says has a plan in place for delegation. Huge for her that he is supportive but still overwhelmed can't get a hold of stuff can't help but go into fight or flight survival thing can't get a hold of it again doesn't know but thinks it her. Therapy session spent on working on the issue particularly what might be feeding it including perfectionism. Hair falling out tension everywhere husband said not doing this not going backwards. She feels started to to go backward expectation on her can't get out of it but think it is coming from her. Engages in self-talk but can't get there. Look at other jobs applied. About to give her a raise though.  Patient says helps in session to talk through.  Was saying to her self ok good enough but started to go backwards.  Wants to feel this is really good doesn't have to be perfect. Did everything could and people said that too but then patient says not done enough should have done this. Tara Weaver thinks she is doing great. She doesn't think so  Patient responded to looking at physical interventions saying can see where we need that to exhale like of the stress and therapist noted also in  the brain it helps her reasonable brain to take over then we will be able to cope more effectively.  Started off session talking about grief therapist sees progress in her grief process emotionally able to regulate therapist notes the journey and healing with grief means going from focusing on the loss to more present focus and can see that with weight patient's reacting to anniversaries.  Looked at her going backward with work feeling overwhelmed we took a look again at perfectionism to take another look to practice some strategies noting perfectionism works against you cannot meet those standards so failed to satisfied need a more reasonable standard need to not tie self-worth to performance.  Patient will review packet and we will talk about it next session therapist added strategies that go through the body things like calming phrase, cold water chanting that affects the vagus nerve that we need in the moment to get out of fight/flight so that are rational brain can take over.   Suicidal/Homicidal: No  Plan: Return again in 2 weeks.2.  Patient look at perfectionism worksheet continue to work on managing stress at work including setting boundaries strategies to get out of fight/flight continue to work on grief  Diagnosis: Generalized Anxiety Disorder, work stress, grief trauma and stress related disorder  Collaboration of Care: Other none needed  Patient/Guardian was advised Release of Information must be obtained prior to any record release in order to collaborate their care with an outside provider. Patient/Guardian was advised if they have not already done so to contact the registration department to sign all necessary forms in order for [place] to release information regarding their care.   Consent: Patient/Guardian gives verbal consent for treatment and assignment of benefits for services provided during this visit. Patient/Guardian expressed understanding and agreed to proceed.   Coolidge Breeze,  LCSW 04/16/2023

## 2023-04-24 ENCOUNTER — Encounter

## 2023-04-29 ENCOUNTER — Other Ambulatory Visit: Payer: Self-pay | Admitting: Family

## 2023-04-29 DIAGNOSIS — Z0000 Encounter for general adult medical examination without abnormal findings: Secondary | ICD-10-CM

## 2023-05-05 IMAGING — MG MM DIGITAL SCREENING BILAT W/ TOMO AND CAD
8 series · 9 of 24 positions shown · non-contrast
Comparison: Previous exam(s).

CLINICAL DATA: Screening.

EXAM:
DIGITAL SCREENING BILATERAL MAMMOGRAM WITH TOMOSYNTHESIS AND CAD
TECHNIQUE: Bilateral screening digital craniocaudal and mediolateral oblique
mammograms were obtained. Bilateral screening digital breast
tomosynthesis was performed. The images were evaluated with
computer-aided detection.

[R CC synth-2D]
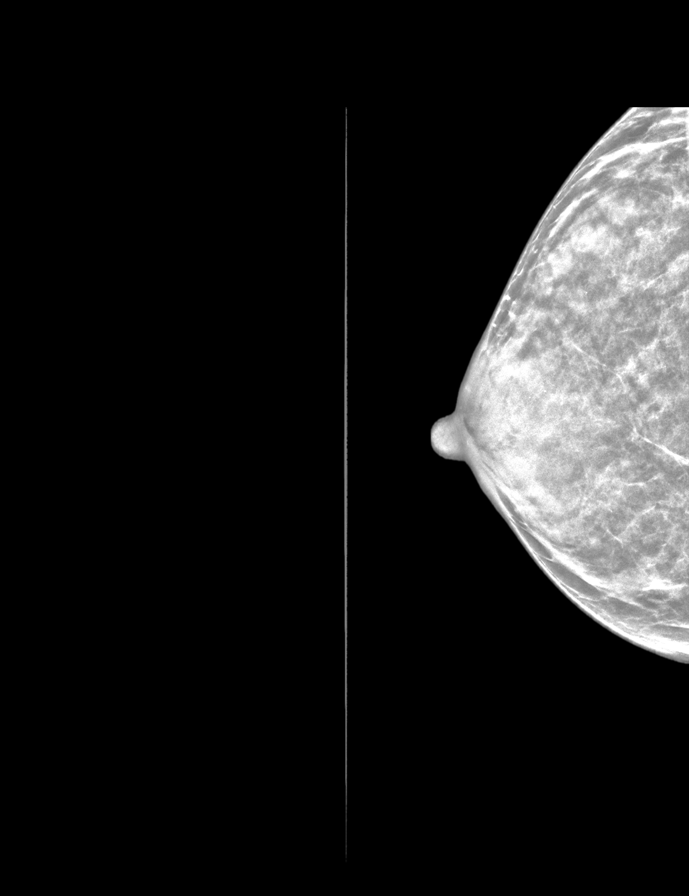

[L CC synth-2D]
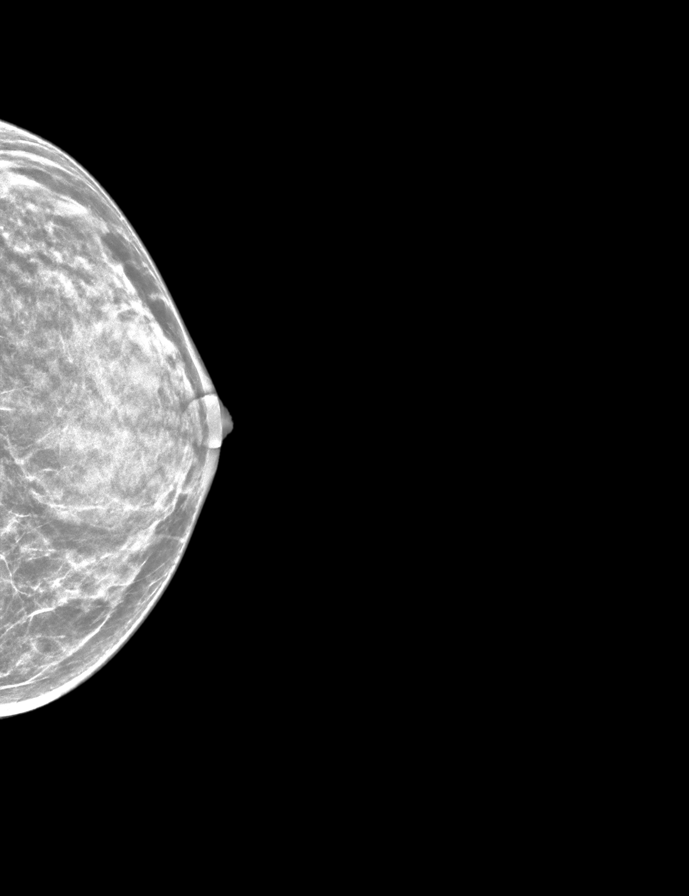

[R MLO synth-2D]
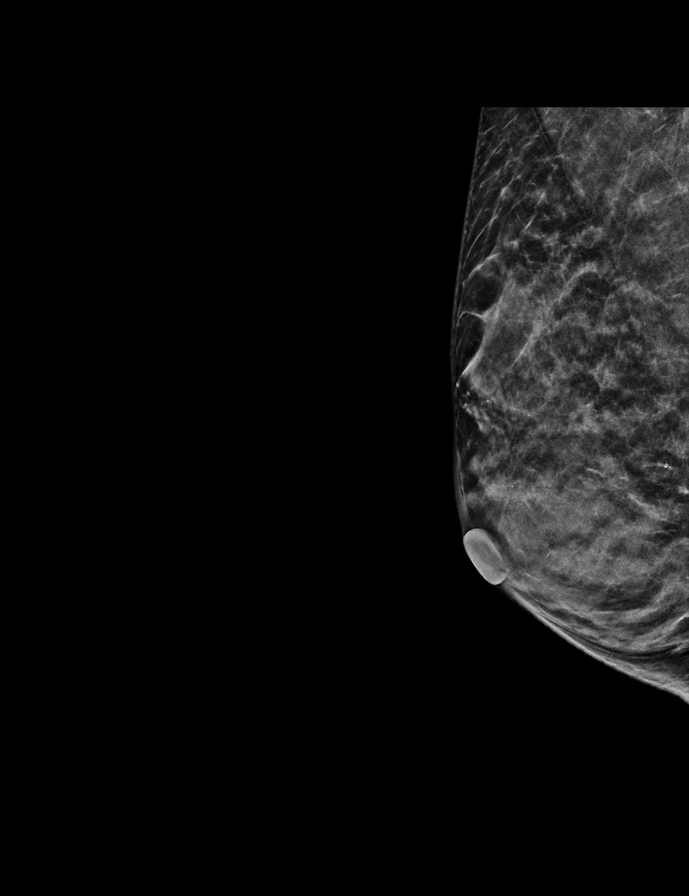

[L MLO synth-2D]
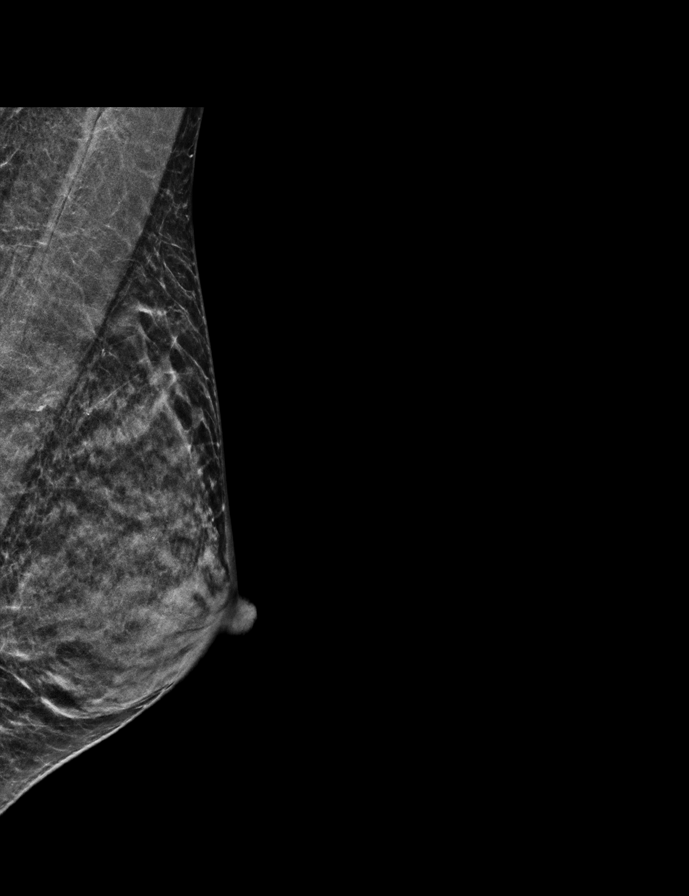

[R MLO tomo · 2 of 37 frames shown]
[frame 13/37]
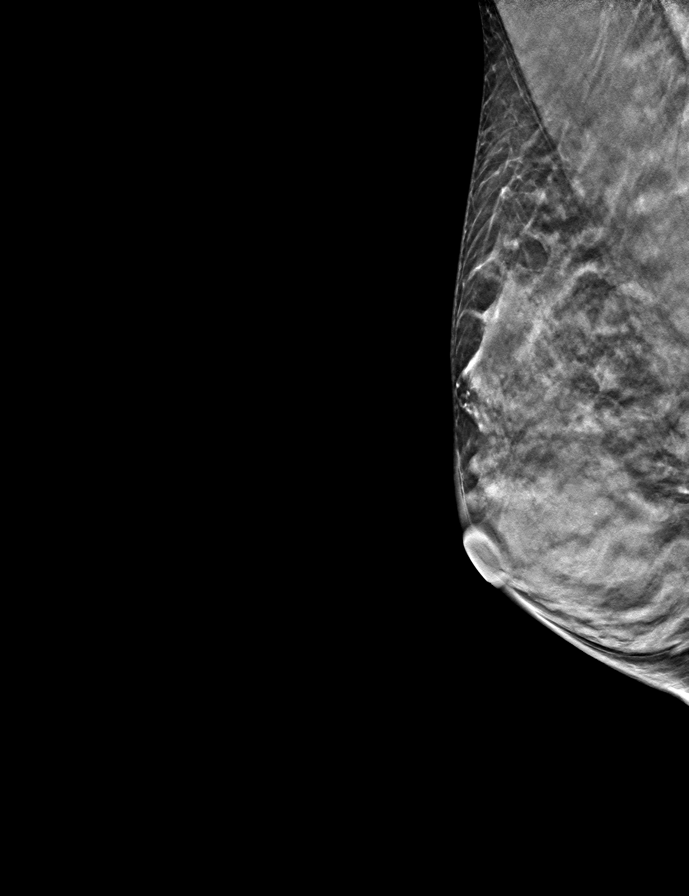
[frame 19/37]
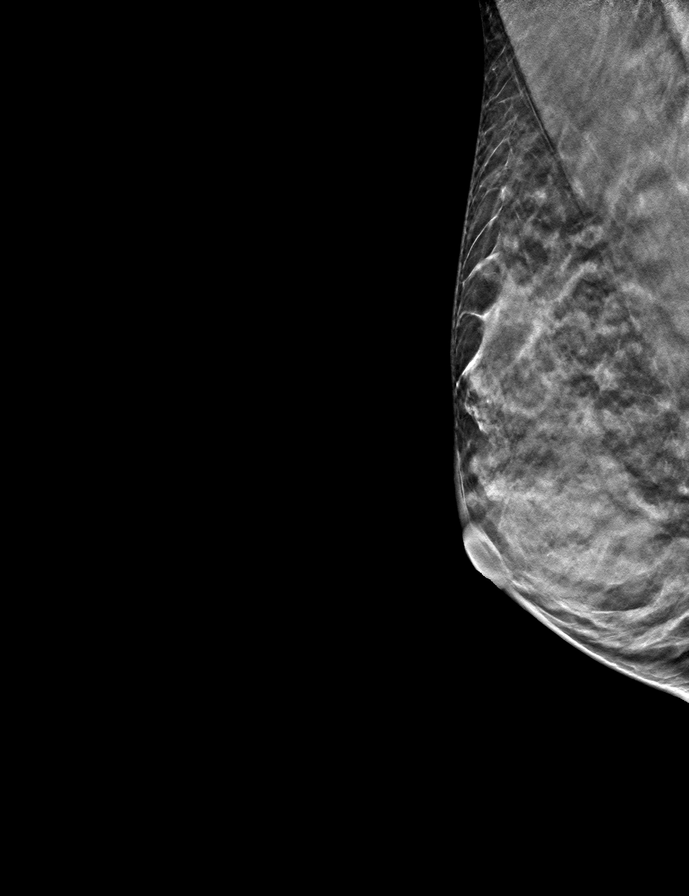

[R CC tomo · tomo slice 19/37.0]
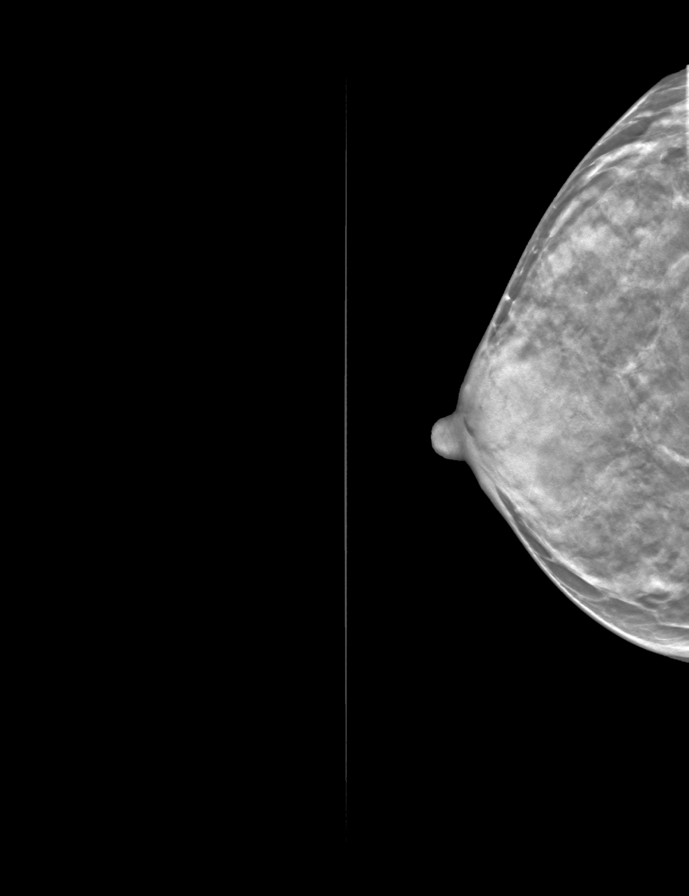

[L CC tomo · tomo slice 20/39.0]
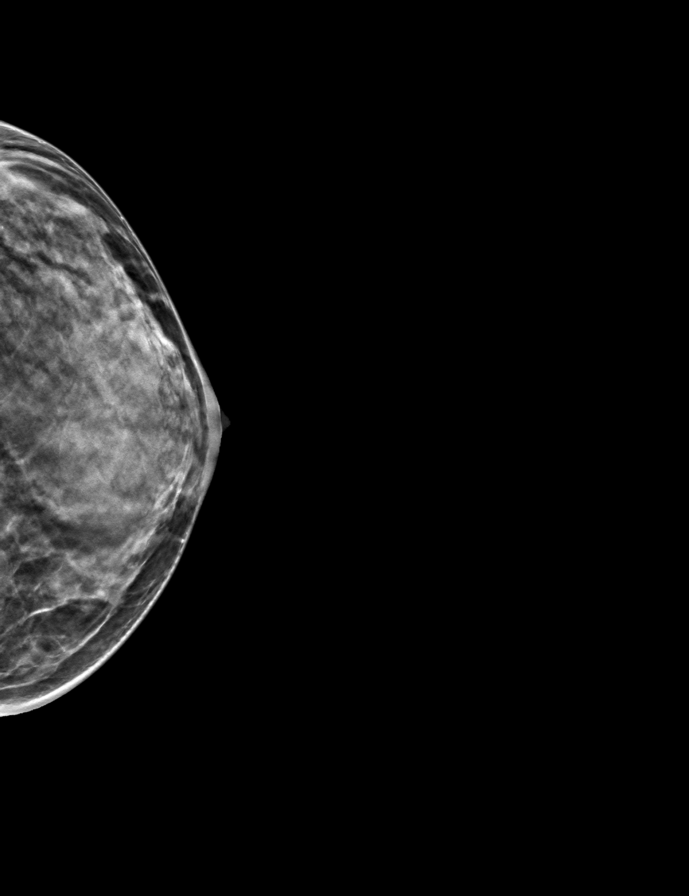

[L MLO tomo · tomo slice 21/40.0]
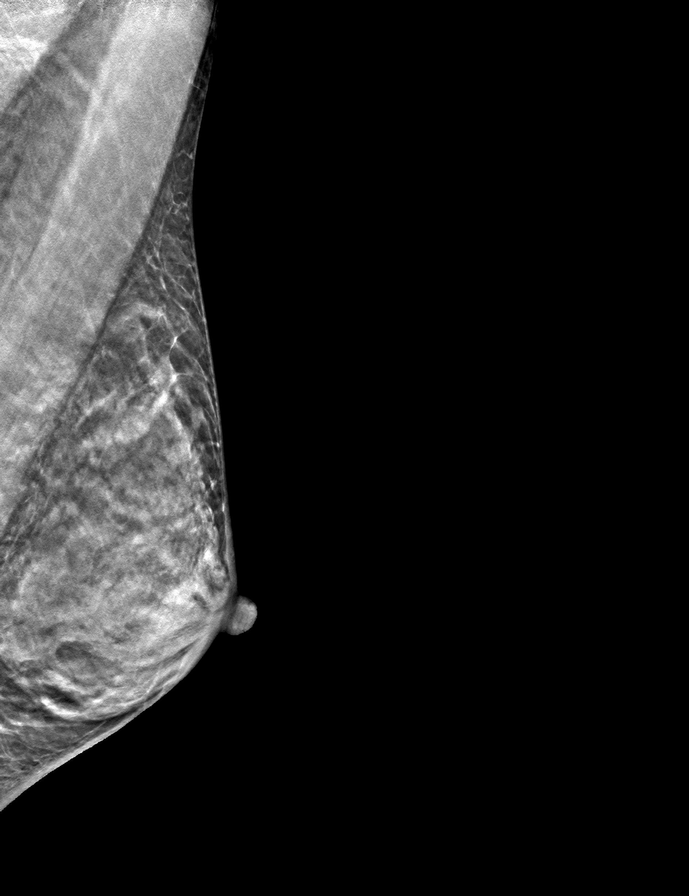

[9 of 24 positions shown; findings below may reference images not displayed]

ACR Breast Density Category d: The breast tissue is extremely dense,
which lowers the sensitivity of mammography
FINDINGS: There are no findings suspicious for malignancy.
IMPRESSION: No mammographic evidence of malignancy. A result letter of this
screening mammogram will be mailed directly to the patient.

RECOMMENDATION:
Screening mammogram in one year. (Code:[id number])

BI-RADS CATEGORY  1: Negative.

## 2023-05-25 ENCOUNTER — Other Ambulatory Visit: Payer: Self-pay | Admitting: Family

## 2023-05-25 DIAGNOSIS — Z1231 Encounter for screening mammogram for malignant neoplasm of breast: Secondary | ICD-10-CM

## 2023-05-29 ENCOUNTER — Encounter

## 2023-06-04 ENCOUNTER — Ambulatory Visit
Admission: RE | Admit: 2023-06-04 | Discharge: 2023-06-04 | Disposition: A | Discharge status: Home / Self Care | Source: Ambulatory Visit

## 2023-06-04 DIAGNOSIS — Z1231 Encounter for screening mammogram for malignant neoplasm of breast: Secondary | ICD-10-CM
